# Patient Record
Sex: Male | Born: 1965
Health system: Southern US, Community
[De-identification: ages and names within clinical notes are randomized; demographics above are authoritative.]

## PROBLEM LIST (undated history)

## (undated) DIAGNOSIS — E119 Type 2 diabetes mellitus without complications: Secondary | ICD-10-CM

## (undated) DIAGNOSIS — I10 Essential (primary) hypertension: Secondary | ICD-10-CM

## (undated) HISTORY — PX: COLONOSCOPY: SHX174

## (undated) HISTORY — PX: SHOULDER SURGERY: SHX246

---

## 2006-06-05 ENCOUNTER — Emergency Department (HOSPITAL_COMMUNITY): Admission: EM | Admit: 2006-06-05 | Discharge: 2006-06-05 | Payer: Self-pay | Admitting: Emergency Medicine

## 2009-12-23 ENCOUNTER — Emergency Department (HOSPITAL_COMMUNITY): Admission: EM | Admit: 2009-12-23 | Discharge: 2009-12-23 | Payer: Self-pay | Admitting: Emergency Medicine

## 2019-04-15 ENCOUNTER — Other Ambulatory Visit: Payer: Self-pay

## 2019-04-15 ENCOUNTER — Encounter (HOSPITAL_COMMUNITY): Payer: Self-pay

## 2019-04-15 ENCOUNTER — Observation Stay (HOSPITAL_COMMUNITY)
Admission: EM | Admit: 2019-04-15 | Discharge: 2019-04-17 | Disposition: A | Discharge status: Home / Self Care | Payer: Self-pay | Attending: Internal Medicine | Admitting: Internal Medicine

## 2019-04-15 ENCOUNTER — Emergency Department (HOSPITAL_COMMUNITY): Payer: Self-pay

## 2019-04-15 DIAGNOSIS — R29898 Other symptoms and signs involving the musculoskeletal system: Secondary | ICD-10-CM | POA: Insufficient documentation

## 2019-04-15 DIAGNOSIS — Z20828 Contact with and (suspected) exposure to other viral communicable diseases: Secondary | ICD-10-CM | POA: Insufficient documentation

## 2019-04-15 DIAGNOSIS — R209 Unspecified disturbances of skin sensation: Secondary | ICD-10-CM

## 2019-04-15 DIAGNOSIS — E876 Hypokalemia: Secondary | ICD-10-CM | POA: Insufficient documentation

## 2019-04-15 DIAGNOSIS — Z794 Long term (current) use of insulin: Secondary | ICD-10-CM

## 2019-04-15 DIAGNOSIS — E119 Type 2 diabetes mellitus without complications: Secondary | ICD-10-CM

## 2019-04-15 DIAGNOSIS — R55 Syncope and collapse: Principal | ICD-10-CM | POA: Insufficient documentation

## 2019-04-15 DIAGNOSIS — E860 Dehydration: Secondary | ICD-10-CM | POA: Insufficient documentation

## 2019-04-15 DIAGNOSIS — I10 Essential (primary) hypertension: Secondary | ICD-10-CM | POA: Insufficient documentation

## 2019-04-15 DIAGNOSIS — E1169 Type 2 diabetes mellitus with other specified complication: Secondary | ICD-10-CM

## 2019-04-15 DIAGNOSIS — I639 Cerebral infarction, unspecified: Secondary | ICD-10-CM

## 2019-04-15 DIAGNOSIS — E1165 Type 2 diabetes mellitus with hyperglycemia: Secondary | ICD-10-CM | POA: Insufficient documentation

## 2019-04-15 HISTORY — DX: Essential (primary) hypertension: I10

## 2019-04-15 HISTORY — DX: Type 2 diabetes mellitus without complications: E11.9

## 2019-04-15 LAB — CBC
HCT: 39.5 % (ref 39.0–52.0)
Hemoglobin: 12.7 g/dL — ABNORMAL LOW (ref 13.0–17.0)
MCH: 26 pg (ref 26.0–34.0)
MCHC: 32.2 g/dL (ref 30.0–36.0)
MCV: 80.8 fL (ref 80.0–100.0)
Platelets: 383 10*3/uL (ref 150–400)
RBC: 4.89 MIL/uL (ref 4.22–5.81)
RDW: 14 % (ref 11.5–15.5)
WBC: 6.7 10*3/uL (ref 4.0–10.5)
nRBC: 0 % (ref 0.0–0.2)

## 2019-04-15 LAB — COMPREHENSIVE METABOLIC PANEL
ALT: 36 U/L (ref 0–44)
AST: 24 U/L (ref 15–41)
Albumin: 4.3 g/dL (ref 3.5–5.0)
Alkaline Phosphatase: 52 U/L (ref 38–126)
Anion gap: 8 (ref 5–15)
BUN: 20 mg/dL (ref 6–20)
CO2: 27 mmol/L (ref 22–32)
Calcium: 9 mg/dL (ref 8.9–10.3)
Chloride: 100 mmol/L (ref 98–111)
Creatinine, Ser: 1.43 mg/dL — ABNORMAL HIGH (ref 0.61–1.24)
GFR calc Af Amer: >60 mL/min (ref >60)
GFR calc non Af Amer: 56 mL/min — ABNORMAL LOW (ref >60)
Glucose, Bld: 237 mg/dL — ABNORMAL HIGH (ref 70–99)
Potassium: 3.3 mmol/L — ABNORMAL LOW (ref 3.5–5.1)
Sodium: 135 mmol/L (ref 135–145)
Total Bilirubin: 0.9 mg/dL (ref 0.3–1.2)
Total Protein: 7.7 g/dL (ref 6.5–8.1)

## 2019-04-15 LAB — CK: Total CK: 370 U/L (ref 49–397)

## 2019-04-15 LAB — CBG MONITORING, ED: Glucose-Capillary: 227 mg/dL — ABNORMAL HIGH (ref 70–99)

## 2019-04-15 LAB — TROPONIN I: Troponin I: <0.03 ng/mL (ref <0.03)

## 2019-04-15 MED ORDER — SODIUM CHLORIDE 0.9 % IV BOLUS
1000.0000 mL | Freq: Once | INTRAVENOUS | Status: AC
  Administered 2019-04-15: 1000 mL via INTRAVENOUS

## 2019-04-15 MED ORDER — SODIUM CHLORIDE 0.9 % IV SOLN
INTRAVENOUS | Status: DC
  Administered 2019-04-15: via INTRAVENOUS

## 2019-04-15 NOTE — ED Provider Notes (Addendum)
[facility] EMERGENCY DEPARTMENT Provider Note   CSN: [id number] Arrival date & time: 04/15/19  1956    History   Chief Complaint Chief Complaint  Patient presents with   Dizziness    HPI Bonnita NasutiGeorge E Zwahlen is a 53 y.o. male.     Patient with no history of diabetes.  And history of hypertension.  Patient was fishing today.  He stopped at [employer] to get some dinner after fishing.  He had just eaten some chicken and he started to feel as if he was got a pass out.  Felt very lightheaded felt a little dizzy no true room spinning.  Felt as if he was can have some difficulty walking got very tired feeling.  No chest pain no headache no abdominal pain no shortness of breath.  Maybe a little slight numbness in his right hand.  Felt as if he was going to pass out but he did not pass out.  And felt as if maybe his vision was off some.     Past Medical History:  Diagnosis Date   Diabetes mellitus without complication (HCC)    Hypertension     There are no active problems to display for this patient.   Past Surgical History:  Procedure Laterality Date   SHOULDER SURGERY          Home Medications    Prior to Admission medications   Medication Sig Start Date End Date Taking? Authorizing Provider  acarbose (PRECOSE) 100 MG tablet Take 100 mg by mouth 2 (two) times a day.   Yes [provider]  amLODipine (NORVASC) 5 MG tablet Take 5 mg by mouth every evening.   Yes [provider]  benazepril (LOTENSIN) 10 MG tablet Take 10 mg by mouth every evening.   Yes [provider]  glyBURIDE-metformin (GLUCOVANCE) 5-500 MG tablet Take 1 tablet by mouth every evening.   Yes [provider]  hydrochlorothiazide (HYDRODIURIL) 25 MG tablet Take 25 mg by mouth every evening.   Yes [provider]  insulin NPH Human (NOVOLIN N) 100 UNIT/ML injection Inject 20 Units into the skin daily.   Yes [provider]  insulin regular (NOVOLIN R) 100  units/mL injection Inject 10 Units into the skin 2 (two) times a day.   Yes [provider]  Omega-3 Fatty Acids (FISH OIL PO) Take 1 capsule by mouth every evening.   Yes [provider]    Family History No family history on file.  Social History Social History   Tobacco Use   Smoking status: Never Smoker   Smokeless tobacco: Never Used  Substance Use Topics   Alcohol use: Never    Frequency: Never   Drug use: Never     Allergies   Patient has no known allergies.   Review of Systems Review of Systems  Constitutional: Positive for fatigue. Negative for chills and fever.  HENT: Negative for congestion, rhinorrhea and sore throat.   Eyes: Positive for visual disturbance. Negative for photophobia.  Respiratory: Negative for cough and shortness of breath.   Cardiovascular: Negative for chest pain and leg swelling.  Gastrointestinal: Negative for abdominal pain, diarrhea, nausea and vomiting.  Genitourinary: Negative for dysuria.  Musculoskeletal: Negative for back pain and neck pain.  Skin: Negative for rash.  Neurological: Positive for dizziness, light-headedness and numbness. Negative for syncope, speech difficulty, weakness and headaches.  Hematological: Does not bruise/bleed easily.  Psychiatric/Behavioral: Negative for confusion.     Physical Exam Updated Vital  Signs BP 114/67    Pulse 95    Temp 98.2 F (36.8 C) (Oral)    Resp 17    Ht 1.626 m (5\' 4" )    Wt 95.3 kg    SpO2 100%    BMI 36.05 kg/m   Physical Exam Vitals signs and nursing note reviewed.  Constitutional:      General: He is not in acute distress.    Appearance: Normal appearance. He is well-developed.  HENT:     Head: Normocephalic and atraumatic.  Eyes:     Extraocular Movements: Extraocular movements intact.     Conjunctiva/sclera: Conjunctivae normal.     Pupils: Pupils are equal, round, and reactive to light.  Neck:     Musculoskeletal: Neck supple.  Cardiovascular:      Rate and Rhythm: Normal rate and regular rhythm.     Heart sounds: No murmur.  Pulmonary:     Effort: Pulmonary effort is normal. No respiratory distress.     Breath sounds: Normal breath sounds.  Abdominal:     Palpations: Abdomen is soft.     Tenderness: There is no abdominal tenderness.  Musculoskeletal:        General: No swelling.  Skin:    General: Skin is warm and dry.     Capillary Refill: Capillary refill takes less than 2 seconds.  Neurological:     General: No focal deficit present.     Mental Status: He is alert and oriented to person, place, and time.     Cranial Nerves: No cranial nerve deficit.     Sensory: No sensory deficit.     Motor: No weakness.     Coordination: Coordination abnormal.     Comments: Slight coordination difference finger-to-nose with the right hand.  Patient is right-hand dominant.  Left hand was fine.      ED Treatments / Results  Labs (all labs ordered are listed, but only abnormal results are displayed) Labs Reviewed  CBC - Abnormal; Notable for the following components:      Result Value   Hemoglobin 12.7 (*)    All other components within normal limits  COMPREHENSIVE METABOLIC PANEL - Abnormal; Notable for the following components:   Potassium 3.3 (*)    Glucose, Bld 237 (*)    Creatinine, Ser 1.43 (*)    GFR calc non Af Amer 56 (*)    All other components within normal limits  CBG MONITORING, ED - Abnormal; Notable for the following components:   Glucose-Capillary 227 (*)    All other components within normal limits  SARS CORONAVIRUS 2 (HOSPITAL ORDER, [place] [facility])  CK  TROPONIN I  URINALYSIS, ROUTINE W REFLEX MICROSCOPIC    EKG EKG Interpretation  Date/Time:  Saturday April 15 2019 20:37:02 EDT Ventricular Rate:  109 PR Interval:    QRS Duration: 102 QT Interval:  331 QTC Calculation: 446 R Axis:   -7 Text Interpretation:  Sinus tachycardia RSR' in V1 or V2, right VCD or RVH ST elevation  suggests acute pericarditis No previous ECGs available Confirmed by Fredia Sorrow [phone]) on 04/15/2019 8:50:14 PM   Radiology Dg Chest 2 View  Result Date: 04/15/2019 CLINICAL DATA:  Near syncope EXAM: CHEST - 2 VIEW COMPARISON:  06/05/2006 FINDINGS: The heart size and mediastinal contours are within normal limits. Both lungs are clear. The visualized skeletal structures are unremarkable. IMPRESSION: No active cardiopulmonary disease. Electronically Signed   By: Donavan Foil M.D.   On: 04/15/2019  23:23   Ct Head Wo Contrast  Result Date: 04/15/2019 CLINICAL DATA:  Ataxia EXAM: CT HEAD WITHOUT CONTRAST TECHNIQUE: Contiguous axial images were obtained from the base of the skull through the vertex without intravenous contrast. COMPARISON:  None. FINDINGS: Brain: No evidence of acute infarction, hemorrhage, hydrocephalus, extra-axial collection or mass lesion/mass effect. Vascular: No hyperdense vessel or unexpected calcification. Skull: Normal. Negative for fracture or focal lesion. Sinuses/Orbits: No acute finding. Other: None IMPRESSION: Negative non contrasted CT appearance of the brain Electronically Signed   By: Jasmine PangKim  Fujinaga M.D.   On: 04/15/2019 23:21    Procedures Procedures (including critical care time)  CRITICAL CARE Performed by: Vanetta MuldersScott Amabel Stmarie Total critical care time: 30 minutes Critical care time was exclusive of separately billable procedures and treating other patients. Critical care was necessary to treat or prevent imminent or life-threatening deterioration. Critical care was time spent personally by me on the following activities: development of treatment plan with patient and/or surrogate as well as nursing, discussions with consultants, evaluation of patient's response to treatment, examination of patient, obtaining history from patient or surrogate, ordering and performing treatments and interventions, ordering and review of laboratory studies, ordering and review of  radiographic studies, pulse oximetry and re-evaluation of patient's condition.   Medications Ordered in ED Medications  0.9 %  sodium chloride infusion (has no administration in time range)  sodium chloride 0.9 % bolus 1,000 mL (1,000 mLs Intravenous New Bag/Given 04/15/19 2222)     Initial Impression / Assessment and Plan / ED Course  I have reviewed the triage vital signs and the nursing notes.  Pertinent labs & imaging results that were available during my care of the patient were reviewed by me and considered in my medical decision making (see chart for details).        Patient with some risk factors for stroke.  May have had somewhat of a stroke presentation.  Patient had sudden onset of not feeling well.  Thought maybe was going to pass out but he did not.  Had some dizziness.  Had some numbness in his right hand felt as if his vision was off a little bit.  Felt as if his coordination was off some.  Symptoms could have be secondary to just near syncopal episode or could be secondary to a subtle CVA.  Patient head CT pending.  Patient most likely will require admission.  May need [facility].   Scan without any acute findings.  We will go and order the COVID testing.  Feel patient needs admission.  MRI not available here tonight.   Patient still feels a little bit off in his eyes.  Still has a little bit of numbness in the hand.  Overall he is improving.  Patient symptoms were very subtle.  Patient never a candidate for TPA. Final Clinical Impressions(s) / ED Diagnoses   Final diagnoses:  Near syncope  Cerebrovascular accident (CVA), unspecified mechanism [facility](HCC)    ED Discharge Orders    None       Vanetta MuldersZackowski, Nilda Keathley, MD 04/15/19 2316    Vanetta MuldersZackowski, Abhimanyu Cruces, MD 04/15/19 [id number]    Vanetta MuldersZackowski, Adaley Kiene, MD 04/15/19 2336

## 2019-04-15 NOTE — H&P (Signed)
TRH H&P    Patient Demographics:    Keith HellingGeorge Best, is a 53 y.o. male  MRN: [id number]  DOB - 02/16/1966  Admit Date - 04/15/2019  Referring MD/NP/PA: Idalia NeedleScott Zachowski  Outpatient Primary MD for the patient is Toma DeitersHasanaj, Xaje A, MD  Patient coming from:  home  Chief complaint- right arm weakness   HPI:    Keith Best  is a 53 y.o. male,w hypertension, dm2, apparently was out fishing when experienced dizziness "lightheadedness" , vision off in both eyes, and generalized weakness.  ER physician commented that the patient had right arm weakness and poor finger to nose but the pt states that he had generalized weakness, but the thing that concerned him the most was the dizziness.  No vertigo.   Pt denies headache, cp, palp, sob, n/v, abd pain, diarrhea, brbpr.   In ED,  T 98.2, P 81  R 18, Bp 102/66 pox 98 Wt 95.3 kg   CT brain IMPRESSION: Negative non contrasted CT appearance of the brain  CXR IMPRESSION: No active cardiopulmonary disease.  Wbc 6.7, Hgb 12.7, Plt 383 Na 135, K 3.3, Bun 20, Creatinine 1.43 Trop <0.03  Glucose 237  Pt hydrated with ns iv in ED.  Pt states he feels back to normal.   Pt will be admitted observation for dizziness , near syncope.    Review of systems:    In addition to the HPI above,  No Fever-chills, No Headache, No changes with Vision or hearing, No problems swallowing food or Liquids, No Chest pain, Cough or Shortness of Breath, No Abdominal pain, No Nausea or Vomiting, bowel movements are regular, No Blood in stool or Urine, No dysuria, No new skin rashes or bruises, No new joints pains-aches,  No new weakness, tingling, numbness in any extremity, No recent weight gain or loss, No polyuria, polydypsia or polyphagia, No significant Mental Stressors.  All other systems reviewed and are negative.    Past History of the following :    Past Medical  History:  Diagnosis Date   Diabetes mellitus without complication (HCC)    Hypertension       Past Surgical History:  Procedure Laterality Date   SHOULDER SURGERY        Social History:      Social History   Tobacco Use   Smoking status: Never Smoker   Smokeless tobacco: Never Used  Substance Use Topics   Alcohol use: Never    Frequency: Never       Family History :     Family History  Problem Relation Age of Onset   Hypertension Mother    Diabetes Mother    Hypertension Father        Home Medications:   Prior to Admission medications   Medication Sig Start Date End Date Taking? Authorizing Provider  acarbose (PRECOSE) 100 MG tablet Take 100 mg by mouth 2 (two) times a day.   Yes [provider]  amLODipine (NORVASC) 5 MG tablet Take 5 mg by mouth every evening.   Yes  [provider]  benazepril (LOTENSIN) 10 MG tablet Take 10 mg by mouth every evening.   Yes [provider]  glyBURIDE-metformin (GLUCOVANCE) 5-500 MG tablet Take 1 tablet by mouth every evening.   Yes [provider]  hydrochlorothiazide (HYDRODIURIL) 25 MG tablet Take 25 mg by mouth every evening.   Yes [provider]  insulin NPH Human (NOVOLIN N) 100 UNIT/ML injection Inject 20 Units into the skin daily.   Yes [provider]  insulin regular (NOVOLIN R) 100 units/mL injection Inject 10 Units into the skin 2 (two) times a day.   Yes [provider]  Omega-3 Fatty Acids (FISH OIL PO) Take 1 capsule by mouth every evening.   Yes [provider]     Allergies:    No Known Allergies   Physical Exam:   Vitals  Blood pressure 103/60, pulse 72, temperature (!) 97.5 F (36.4 C), temperature source Oral, resp. rate 16, height 5\' 4"  (1.626 m), weight 95.3 kg, SpO2 97 %.  1.  General: axox3  2. Psychiatric: euthymic  3. Neurologic: cn2-12 intact, reflexes 2+ symmetric, diffuse with no clonus, motor 5/5 in all  4 ext  4. HEENMT:  Anicteric, pupils 1.895mm symmetric, direct, consensual, near intact Mucous membranes slightly dry. Tongue midline, No facial droop Neck: no jvd, no bruit  5. Respiratory : CTAB  6. Cardiovascular : rrr s1, s2, no m/g/r  7. Gastrointestinal:  Abd: soft, nt, nd, +bs  8. Skin:  Ext: no c/c/e,  No rash  9.Musculoskeletal:  Good ROM  No adenopathy    Data Review:    CBC Recent Labs  Lab 04/15/19 2207  WBC 6.7  HGB 12.7*  HCT 39.5  PLT 383  MCV 80.8  MCH 26.0  MCHC 32.2  RDW 14.0   ------------------------------------------------------------------------------------------------------------------  Results for orders placed or performed during the hospital encounter of 04/15/19 (from the past 48 hour(s))  CBG monitoring, ED     Status: Abnormal   Collection Time: 04/15/19  8:11 PM  Result Value Ref Range   Glucose-Capillary 227 (H) 70 - 99 mg/dL  CBC     Status: Abnormal   Collection Time: 04/15/19 10:07 PM  Result Value Ref Range   WBC 6.7 4.0 - 10.5 K/uL   RBC 4.89 4.22 - 5.81 MIL/uL   Hemoglobin 12.7 (L) 13.0 - 17.0 g/dL   HCT 16.139.5 09.639.0 - 04.552.0 %   MCV 80.8 80.0 - 100.0 fL   MCH 26.0 26.0 - 34.0 pg   MCHC 32.2 30.0 - 36.0 g/dL   RDW 40.914.0 81.111.5 - 91.415.5 %   Platelets 383 150 - 400 K/uL   nRBC 0.0 0.0 - 0.2 %    Comment: Performed at [facility], [address] [place], [place] [address]  Comprehensive metabolic panel     Status: Abnormal   Collection Time: 04/15/19 10:07 PM  Result Value Ref Range   Sodium 135 135 - 145 mmol/L   Potassium 3.3 (L) 3.5 - 5.1 mmol/L   Chloride 100 98 - 111 mmol/L   CO2 27 22 - 32 mmol/L   Glucose, Bld 237 (H) 70 - 99 mg/dL   BUN 20 6 - 20 mg/dL   Creatinine, Ser 6.211.43 (H) 0.61 - 1.24 mg/dL   Calcium 9.0 8.9 - 30.810.3 mg/dL   Total Protein 7.7 6.5 - 8.1 g/dL   Albumin 4.3 3.5 - 5.0 g/dL   AST 24 15 - 41 U/L   ALT 36 0 - 44  U/L   Alkaline Phosphatase 52 38 - 126 U/L   Total Bilirubin 0.9 0.3 - 1.2 mg/dL    GFR calc non Af Amer 56 (L) >60 mL/min   GFR calc Af Amer >60 >60 mL/min   Anion gap 8 5 - 15    Comment: Performed at [facility], [address], [place], [place] [address]  CK     Status: None   Collection Time: 04/15/19 10:07 PM  Result Value Ref Range   Total CK 370 49 - 397 U/L    Comment: Performed at [facility], [address], [place], [place] [address]  Troponin I - Once     Status: None   Collection Time: 04/15/19 10:07 PM  Result Value Ref Range   Troponin I <0.03 <0.03 ng/mL    Comment: Performed at [facility], [address] [place], [place] [address]  SARS Coronavirus 2 (CEPHEID - Performed in [facility] hospital lab), Hosp Order     Status: None   Collection Time: 04/15/19 11:36 PM   Specimen: Nasopharyngeal Swab  Result Value Ref Range   SARS Coronavirus 2 NEGATIVE NEGATIVE    Comment: (NOTE) If result is NEGATIVE SARS-CoV-2 target nucleic acids are NOT DETECTED. The SARS-CoV-2 RNA is generally detectable in upper and lower  respiratory specimens during the acute phase of infection. The lowest  concentration of SARS-CoV-2 viral copies this assay can detect is 250  copies / mL. A negative result does not preclude SARS-CoV-2 infection  and should not be used as the sole basis for treatment or other  patient management decisions.  A negative result may occur with  improper specimen collection / handling, submission of specimen other  than nasopharyngeal swab, presence of viral mutation(s) within the  areas targeted by this assay, and inadequate number of viral copies  (<250 copies / mL). A negative result must be combined with clinical  observations, patient history, and epidemiological information. If result is POSITIVE SARS-CoV-2 target nucleic acids are DETECTED. The SARS-CoV-2 RNA is generally detectable in upper and lower  respiratory specimens dur ing the acute phase of infection.  Positive  results are indicative of active infection with SARS-CoV-2.   Clinical  correlation with patient history and other diagnostic information is  necessary to determine patient infection status.  Positive results do  not rule out bacterial infection or co-infection with other viruses. If result is PRESUMPTIVE POSTIVE SARS-CoV-2 nucleic acids MAY BE PRESENT.   A presumptive positive result was obtained on the submitted specimen  and confirmed on repeat testing.  While 2019 novel coronavirus  (SARS-CoV-2) nucleic acids may be present in the submitted sample  additional confirmatory testing may be necessary for epidemiological  and / or clinical management purposes  to differentiate between  SARS-CoV-2 and other Sarbecovirus currently known to infect humans.  If clinically indicated additional testing with an alternate test  methodology [id number]) is advised. The SARS-CoV-2 RNA is generally  detectable in upper and lower respiratory sp ecimens during the acute  phase of infection. The expected result is Negative. Fact Sheet for Patients:  StrictlyIdeas.no Fact Sheet for Healthcare Providers: BankingDealers.co.za This test is not yet approved or cleared by the [place] FDA and has been authorized for detection and/or diagnosis of SARS-CoV-2 by FDA under an Emergency Use Authorization (EUA).  This EUA will remain in effect (meaning this test can be used) for the duration of the COVID-19 declaration under Section 564(b)(1) of the Act, 21 U.S.C. section 360bbb-3(b)(1), unless  the authorization is terminated or revoked sooner. Performed at [facility], [address] [place], [place] [address]     Chemistries  Recent Labs  Lab 04/15/19 2207  NA 135  K 3.3*  CL 100  CO2 27  GLUCOSE 237*  BUN 20  CREATININE 1.43*  CALCIUM 9.0  AST 24  ALT 36  ALKPHOS 52  BILITOT 0.9    ------------------------------------------------------------------------------------------------------------------  ------------------------------------------------------------------------------------------------------------------ GFR: Estimated Creatinine Clearance: 62.9 mL/min (A) (by C-G formula based on SCr of 1.43 mg/dL (H)). Liver Function Tests: Recent Labs  Lab 04/15/19 2207  AST 24  ALT 36  ALKPHOS 52  BILITOT 0.9  PROT 7.7  ALBUMIN 4.3   No results for input(s): LIPASE, AMYLASE in the last 168 hours. No results for input(s): AMMONIA in the last 168 hours. Coagulation Profile: No results for input(s): INR, PROTIME in the last 168 hours. Cardiac Enzymes: Recent Labs  Lab 04/15/19 2207  CKTOTAL 370  TROPONINI <0.03   BNP (last 3 results) No results for input(s): PROBNP in the last 8760 hours. HbA1C: No results for input(s): HGBA1C in the last 72 hours. CBG: Recent Labs  Lab 04/15/19 2011  GLUCAP 227*   Lipid Profile: No results for input(s): CHOL, HDL, LDLCALC, TRIG, CHOLHDL, LDLDIRECT in the last 72 hours. Thyroid Function Tests: No results for input(s): TSH, T4TOTAL, FREET4, T3FREE, THYROIDAB in the last 72 hours. Anemia Panel: No results for input(s): VITAMINB12, FOLATE, FERRITIN, TIBC, IRON, RETICCTPCT in the last 72 hours.  --------------------------------------------------------------------------------------------------------------- Urine analysis: No results found for: COLORURINE, APPEARANCEUR, LABSPEC, PHURINE, GLUCOSEU, HGBUR, BILIRUBINUR, KETONESUR, PROTEINUR, UROBILINOGEN, NITRITE, LEUKOCYTESUR    Imaging Results:    Dg Chest 2 View  Result Date: 04/15/2019 CLINICAL DATA:  Near syncope EXAM: CHEST - 2 VIEW COMPARISON:  06/05/2006 FINDINGS: The heart size and mediastinal contours are within normal limits. Both lungs are clear. The visualized skeletal structures are unremarkable. IMPRESSION: No active cardiopulmonary disease. Electronically  Signed   By: Jasmine Pang M.D.   On: 04/15/2019 23:23   Ct Head Wo Contrast  Result Date: 04/15/2019 CLINICAL DATA:  Ataxia EXAM: CT HEAD WITHOUT CONTRAST TECHNIQUE: Contiguous axial images were obtained from the base of the skull through the vertex without intravenous contrast. COMPARISON:  None. FINDINGS: Brain: No evidence of acute infarction, hemorrhage, hydrocephalus, extra-axial collection or mass lesion/mass effect. Vascular: No hyperdense vessel or unexpected calcification. Skull: Normal. Negative for fracture or focal lesion. Sinuses/Orbits: No acute finding. Other: None IMPRESSION: Negative non contrasted CT appearance of the brain Electronically Signed   By: Jasmine Pang M.D.   On: 04/15/2019 23:21       Assessment & Plan:    Principal Problem:   Near syncope Active Problems:   Diabetes (HCC)   Essential hypertension  Dizziness, near syncope Tele Trop I q6h x3 Check MRI brain Check carotid ultrasound Check cardiac echo Repeat 12 lead ekg Check orthostatic bp  Tachycardia Tele Check Trop I q6h x3, TSH D dimer , if positive then CTA chest r/o PE  Hypokalemia Replete Check cmp in am  Hypertension Cont Amlodipine  po qday Cont Benazepril  po qday Cont Hydrochlorothiazide  Dm2 Cont Acarbose Cont Glyburide Metformin Cont NPH, Novolin R FSBS ac and qhs, ISS    DVT Prophylaxis-   Lovenox - SCDs   AM Labs Ordered, also please review Full Orders  Family Communication: Admission, patients condition and plan of care including tests being ordered have been discussed with the patient and wife  who indicate understanding  and agree with the plan and Code Status.  Code Status:  FULL   Admission status: Observation : Based on patients clinical presentation and evaluation of above clinical data, I have made determination that patient meets observation criteria at this time.  Time spent in minutes : 70   Pearson GrippeJames Tatanisha Cuthbert M.D on 04/16/2019 at 1:49 AM

## 2019-04-15 NOTE — ED Triage Notes (Signed)
Pt reports feeling "funny" like a "dizzy funny". Pt says he started feeling this way while fishing this afternoon. Pt c/o dry mouth, dizziness, difficulty concentrating, fatigue. Pt is diabetic and does not take medication as prescribed "all the time". Pt reports drinking water today, 2-3 bottles.

## 2019-04-15 NOTE — ED Notes (Signed)
Pt states he is not able to urinate at this time, aware of DO, urinal placed at bedside & pt advised to hit call light when sample is ready

## 2019-04-16 ENCOUNTER — Observation Stay (HOSPITAL_BASED_OUTPATIENT_CLINIC_OR_DEPARTMENT_OTHER): Payer: Self-pay

## 2019-04-16 ENCOUNTER — Observation Stay (HOSPITAL_COMMUNITY): Payer: Self-pay

## 2019-04-16 DIAGNOSIS — R55 Syncope and collapse: Secondary | ICD-10-CM | POA: Diagnosis present

## 2019-04-16 DIAGNOSIS — E119 Type 2 diabetes mellitus without complications: Secondary | ICD-10-CM

## 2019-04-16 DIAGNOSIS — I10 Essential (primary) hypertension: Secondary | ICD-10-CM | POA: Diagnosis present

## 2019-04-16 DIAGNOSIS — R209 Unspecified disturbances of skin sensation: Secondary | ICD-10-CM

## 2019-04-16 LAB — URINALYSIS, ROUTINE W REFLEX MICROSCOPIC
Bacteria, UA: NONE SEEN
Bilirubin Urine: NEGATIVE
Glucose, UA: >=500 mg/dL — AB
Hgb urine dipstick: NEGATIVE
Ketones, ur: NEGATIVE mg/dL
Leukocytes,Ua: NEGATIVE
Nitrite: NEGATIVE
Protein, ur: NEGATIVE mg/dL
Specific Gravity, Urine: 1.021 (ref 1.005–1.030)
pH: 6 (ref 5.0–8.0)

## 2019-04-16 LAB — TROPONIN I
Troponin I: <0.03 ng/mL (ref <0.03)
Troponin I: <0.03 ng/mL (ref <0.03)
Troponin I: <0.03 ng/mL (ref <0.03)

## 2019-04-16 LAB — ECHOCARDIOGRAM COMPLETE
Height: 64 in
Weight: 3255.75 oz

## 2019-04-16 LAB — CBC
HCT: 38.8 % — ABNORMAL LOW (ref 39.0–52.0)
Hemoglobin: 12.6 g/dL — ABNORMAL LOW (ref 13.0–17.0)
MCH: 26.7 pg (ref 26.0–34.0)
MCHC: 32.5 g/dL (ref 30.0–36.0)
MCV: 82.2 fL (ref 80.0–100.0)
Platelets: 323 10*3/uL (ref 150–400)
RBC: 4.72 MIL/uL (ref 4.22–5.81)
RDW: 14 % (ref 11.5–15.5)
WBC: 7.1 10*3/uL (ref 4.0–10.5)
nRBC: 0 % (ref 0.0–0.2)

## 2019-04-16 LAB — TSH
TSH: 0.496 u[IU]/mL (ref 0.350–4.500)
TSH: 0.578 u[IU]/mL (ref 0.350–4.500)

## 2019-04-16 LAB — GLUCOSE, CAPILLARY
Glucose-Capillary: 180 mg/dL — ABNORMAL HIGH (ref 70–99)
Glucose-Capillary: 205 mg/dL — ABNORMAL HIGH (ref 70–99)
Glucose-Capillary: 89 mg/dL (ref 70–99)
Glucose-Capillary: 146 mg/dL — ABNORMAL HIGH (ref 70–99)

## 2019-04-16 LAB — COMPREHENSIVE METABOLIC PANEL
ALT: 31 U/L (ref 0–44)
AST: 20 U/L (ref 15–41)
Albumin: 3.6 g/dL (ref 3.5–5.0)
Alkaline Phosphatase: 53 U/L (ref 38–126)
Anion gap: 5 (ref 5–15)
BUN: 19 mg/dL (ref 6–20)
CO2: 25 mmol/L (ref 22–32)
Calcium: 8.4 mg/dL — ABNORMAL LOW (ref 8.9–10.3)
Chloride: 105 mmol/L (ref 98–111)
Creatinine, Ser: 1.27 mg/dL — ABNORMAL HIGH (ref 0.61–1.24)
GFR calc Af Amer: >60 mL/min (ref >60)
GFR calc non Af Amer: >60 mL/min (ref >60)
Glucose, Bld: 227 mg/dL — ABNORMAL HIGH (ref 70–99)
Potassium: 3.9 mmol/L (ref 3.5–5.1)
Sodium: 135 mmol/L (ref 135–145)
Total Bilirubin: 0.9 mg/dL (ref 0.3–1.2)
Total Protein: 6.7 g/dL (ref 6.5–8.1)

## 2019-04-16 LAB — VITAMIN B12: Vitamin B-12: 189 pg/mL (ref 180–914)

## 2019-04-16 LAB — T4, FREE: Free T4: 0.81 ng/dL (ref 0.61–1.12)

## 2019-04-16 LAB — MAGNESIUM: Magnesium: 2.2 mg/dL (ref 1.7–2.4)

## 2019-04-16 LAB — RAPID URINE DRUG SCREEN, HOSP PERFORMED
Amphetamines: NOT DETECTED
Barbiturates: NOT DETECTED
Benzodiazepines: NOT DETECTED
Cocaine: NOT DETECTED
Opiates: NOT DETECTED
Tetrahydrocannabinol: POSITIVE — AB

## 2019-04-16 LAB — D-DIMER, QUANTITATIVE: D-Dimer, Quant: <0.27 ug/mL-FEU (ref 0.00–0.50)

## 2019-04-16 LAB — FOLATE: Folate: 9.4 ng/mL (ref >5.9)

## 2019-04-16 LAB — SARS CORONAVIRUS 2 BY RT PCR (HOSPITAL ORDER, PERFORMED IN ~~LOC~~ HOSPITAL LAB): SARS Coronavirus 2: NEGATIVE

## 2019-04-16 LAB — CK: Total CK: 302 U/L (ref 49–397)

## 2019-04-16 MED ORDER — INSULIN ASPART 100 UNIT/ML ~~LOC~~ SOLN: 0.0000 [IU] | Freq: Every day | SUBCUTANEOUS | Status: DC

## 2019-04-16 MED ORDER — ACETAMINOPHEN 650 MG RE SUPP: 650.0000 mg | Freq: Four times a day (QID) | RECTAL | Status: DC | PRN

## 2019-04-16 MED ORDER — POTASSIUM CHLORIDE IN NACL 20-0.9 MEQ/L-% IV SOLN
INTRAVENOUS | Status: AC
  Administered 2019-04-16 – 2019-04-17 (×2): via INTRAVENOUS

## 2019-04-16 MED ORDER — BENAZEPRIL HCL 10 MG PO TABS: 10.0000 mg | ORAL_TABLET | Freq: Every evening | ORAL | Status: DC

## 2019-04-16 MED ORDER — GLYBURIDE 5 MG PO TABS: 5.0000 mg | ORAL_TABLET | Freq: Every day | ORAL | Status: DC

## 2019-04-16 MED ORDER — POTASSIUM CHLORIDE CRYS ER 20 MEQ PO TBCR
20.0000 meq | EXTENDED_RELEASE_TABLET | Freq: Once | ORAL | Status: AC
  Administered 2019-04-16: 02:00:00 20 meq via ORAL
  Filled 2019-04-16: qty 1

## 2019-04-16 MED ORDER — SODIUM CHLORIDE 0.9% FLUSH: 3.0000 mL | Freq: Two times a day (BID) | INTRAVENOUS | Status: DC

## 2019-04-16 MED ORDER — GLYBURIDE-METFORMIN 5-500 MG PO TABS: 1.0000 | ORAL_TABLET | Freq: Every evening | ORAL | Status: DC

## 2019-04-16 MED ORDER — INSULIN ASPART 100 UNIT/ML ~~LOC~~ SOLN
0.0000 [IU] | Freq: Three times a day (TID) | SUBCUTANEOUS | Status: DC
  Administered 2019-04-16: 14:00:00 3 [IU] via SUBCUTANEOUS
  Administered 2019-04-16: 2 [IU] via SUBCUTANEOUS
  Administered 2019-04-17: 1 [IU] via SUBCUTANEOUS
  Administered 2019-04-17: 3 [IU] via SUBCUTANEOUS

## 2019-04-16 MED ORDER — AMLODIPINE BESYLATE 5 MG PO TABS
5.0000 mg | ORAL_TABLET | Freq: Every evening | ORAL | Status: DC
  Administered 2019-04-16: 18:00:00 5 mg via ORAL
  Filled 2019-04-16: qty 1

## 2019-04-16 MED ORDER — SODIUM CHLORIDE 0.9% FLUSH: 3.0000 mL | INTRAVENOUS | Status: DC | PRN

## 2019-04-16 MED ORDER — ACARBOSE 50 MG PO TABS
100.0000 mg | ORAL_TABLET | Freq: Two times a day (BID) | ORAL | Status: DC
  Administered 2019-04-16 (×2): 100 mg via ORAL
  Filled 2019-04-16: qty 2
  Filled 2019-04-16: qty 1
  Filled 2019-04-16 (×2): qty 2
  Filled 2019-04-16: qty 1
  Filled 2019-04-16: qty 2
  Filled 2019-04-16: qty 1

## 2019-04-16 MED ORDER — ENOXAPARIN SODIUM 60 MG/0.6ML ~~LOC~~ SOLN
0.5000 mg/kg | SUBCUTANEOUS | Status: DC
  Administered 2019-04-16 – 2019-04-17 (×2): 50 mg via SUBCUTANEOUS
  Filled 2019-04-16 (×2): qty 0.6

## 2019-04-16 MED ORDER — INSULIN ASPART 100 UNIT/ML ~~LOC~~ SOLN
8.0000 [IU] | Freq: Two times a day (BID) | SUBCUTANEOUS | Status: DC
  Administered 2019-04-16 – 2019-04-17 (×3): 8 [IU] via SUBCUTANEOUS

## 2019-04-16 MED ORDER — INSULIN NPH (HUMAN) (ISOPHANE) 100 UNIT/ML ~~LOC~~ SUSP
20.0000 [IU] | Freq: Every day | SUBCUTANEOUS | Status: DC
  Administered 2019-04-16 – 2019-04-17 (×2): 20 [IU] via SUBCUTANEOUS
  Filled 2019-04-16: qty 10

## 2019-04-16 MED ORDER — ACETAMINOPHEN 325 MG PO TABS: 650.0000 mg | ORAL_TABLET | Freq: Four times a day (QID) | ORAL | Status: DC | PRN

## 2019-04-16 MED ORDER — SODIUM CHLORIDE 0.9 % IV SOLN: 250.0000 mL | INTRAVENOUS | Status: DC | PRN

## 2019-04-16 MED ORDER — METFORMIN HCL 500 MG PO TABS: 500.0000 mg | ORAL_TABLET | Freq: Every day | ORAL | Status: DC

## 2019-04-16 MED ORDER — HYDROCHLOROTHIAZIDE 25 MG PO TABS: 25.0000 mg | ORAL_TABLET | Freq: Every evening | ORAL | Status: DC

## 2019-04-16 NOTE — Discharge Summary (Addendum)
Physician Discharge Summary  Keith Best WIO:[id number] DOB: [birth year] DOA: 04/15/2019  PCP: Neale Burly, MD  Admit date: 04/15/2019 Discharge date: 04/17/2019  Admitted From: Home Disposition:  Home  Recommendations for Outpatient Follow-up:  1. Follow up with PCP in 1-2 weeks 2. Please obtain BMP/CBC in one week   Discharge Condition: Stable CODE STATUS: FULL Diet recommendation: Heart Healthy / Carb Modified    Brief/Interim Summary: 53 year old male with a history of diabetes mellitus, hypertension presenting with vertigo and near syncope. The patient states that after 30 minutes of fishing, he developed some dizziness. He felt like his head was swimming. He went to lay down for about 15 minutes. During that time, he developed some right upper extremity numbness and tingling which lasted about 5 minutes. He felt like he had some transient visual disturbance/blurry vision. He denied any focal extremity weakness, nausea, vomiting, chest pain, shortness of breath, headache, fevers, chills. He felt like his mouth was dry at the time. He tried to stand up subsequently and continued to have worsening dizziness. As result he presented for further evaluation. He denies any alcohol or illegal drug use or tobacco. He states that he has been working out in the sun for approximately 3 hours before going to fishing 04/15/2019. He states that he recently had an increase in dosages of his diabetic medications. He denies any NSAIDs or other new medications. He stated that the entire episode lasted about 3 hours. In the emergency room, the patient was afebrile hemodynamically stable with mild tachycardia. Oxygen saturation was 99% room air. BMP showed a potassium 3.3 with sodium 135 and serum creatinine 1.43. CBC and LFTs were essentially unremarkable.  Discharge Diagnoses:  Near Syncope/Sensory Disturbance -MRI brain--neg -likely due to dehydration and hypokalemia -orthostatic  vitals--neg -carotid US-negative for hemodynamically significant stenosis -IVF x 24 hours -Echo--EF 60-65%, impaired relaxation, trivial MR -Urinalysis negative pyuria -Urine drug screen--+THC -Serum B12--189--borderline low, may be contributing--start supplement -Folic EQAS--3.4 -HDQ--2.229 -mag--2.0  Dehydration -Patient presented with serum creatinine 1.43 -Unknown renal baseline -Continue IV fluids -Urinalysis--negative pyuria or ketones -Holding HCTZ--restart after d/c -Holding benazepril--restart after d/c  Diabetes mellitus type 2, uncontrolled with hyperglycemia -Hemoglobin A1c -NovoLog sliding scale -Holding metformin and glyburide--restart after d/c -pt states he just had his DM medications increased by his PCP 2-3 wks prior to this admission -I encouraged pt to follow up with PCP for further intensification of tx if indicated  Essential hypertension -Holding HCTZ--restart after d/c -restart benazepril -Continue amlodipine  Hypokalemia -Repleted -Magnesium 2.0   Discharge Instructions   Allergies as of 04/17/2019   No Known Allergies     Medication List    TAKE these medications   acarbose 100 MG tablet Commonly known as: PRECOSE Take 100 mg by mouth 2 (two) times a day.   amLODipine 5 MG tablet Commonly known as: NORVASC Take 5 mg by mouth every evening.   benazepril 10 MG tablet Commonly known as: LOTENSIN Take 10 mg by mouth every evening.   FISH OIL PO Take 1 capsule by mouth every evening.   glyBURIDE-metformin 5-500 MG tablet Commonly known as: GLUCOVANCE Take 1 tablet by mouth every evening.   hydrochlorothiazide 25 MG tablet Commonly known as: HYDRODIURIL Take 25 mg by mouth every evening.   insulin NPH Human 100 UNIT/ML injection Commonly known as: NOVOLIN N Inject 20 Units into the skin daily.   insulin regular 100 units/mL injection Commonly known as: NOVOLIN R Inject 10 Units into the skin 2 (two)  times a day.        No Known Allergies  Consultations:  none   Procedures/Studies: Dg Chest 2 View  Result Date: 04/15/2019 CLINICAL DATA:  Near syncope EXAM: CHEST - 2 VIEW COMPARISON:  06/05/2006 FINDINGS: The heart size and mediastinal contours are within normal limits. Both lungs are clear. The visualized skeletal structures are unremarkable. IMPRESSION: No active cardiopulmonary disease. Electronically Signed   By: Jasmine Pang M.D.   On: 04/15/2019 23:23   Ct Head Wo Contrast  Result Date: 04/15/2019 CLINICAL DATA:  Ataxia EXAM: CT HEAD WITHOUT CONTRAST TECHNIQUE: Contiguous axial images were obtained from the base of the skull through the vertex without intravenous contrast. COMPARISON:  None. FINDINGS: Brain: No evidence of acute infarction, hemorrhage, hydrocephalus, extra-axial collection or mass lesion/mass effect. Vascular: No hyperdense vessel or unexpected calcification. Skull: Normal. Negative for fracture or focal lesion. Sinuses/Orbits: No acute finding. Other: None IMPRESSION: Negative non contrasted CT appearance of the brain Electronically Signed   By: Jasmine Pang M.D.   On: 04/15/2019 23:21   Mr Brain Wo Contrast  Result Date: 04/17/2019 CLINICAL DATA:  Ataxia EXAM: MRI HEAD WITHOUT CONTRAST TECHNIQUE: Multiplanar, multiecho pulse sequences of the brain and surrounding structures were obtained without intravenous contrast. COMPARISON:  Head CT from 2 days ago FINDINGS: Brain: No acute infarction, hemorrhage, hydrocephalus, extra-axial collection or mass lesion. No white matter disease or atrophy Vascular: Major flow voids are preserved Skull and upper cervical spine: Normal marrow signal. Sinuses/Orbits: Negative. IMPRESSION: Negative brain MRI. Electronically Signed   By: Marnee Spring M.D.   On: 04/17/2019 09:42   US Carotid Bilateral  Result Date: 04/16/2019 CLINICAL DATA:  Dizziness. EXAM: BILATERAL CAROTID DUPLEX ULTRASOUND TECHNIQUE: Wallace Cullens scale imaging, color Doppler and  duplex ultrasound were performed of bilateral carotid and vertebral arteries in the neck. COMPARISON:  None. FINDINGS: Criteria: Quantification of carotid stenosis is based on velocity parameters that correlate the residual internal carotid diameter with NASCET-based stenosis levels, using the diameter of the distal internal carotid lumen as the denominator for stenosis measurement. The following velocity measurements were obtained: RIGHT ICA: 85/32 cm/sec CCA: 85/18 cm/sec SYSTOLIC ICA/CCA RATIO:  1.0 ECA: 119 cm/sec LEFT ICA: 84/32 cm/sec CCA: 104/20 cm/sec SYSTOLIC ICA/CCA RATIO:  0.8 ECA: 155 cm/sec RIGHT CAROTID ARTERY: Small amount of heterogeneous plaque at the right carotid bulb and proximal internal carotid artery. Normal waveforms and velocities in the internal carotid artery. External carotid artery is patent with normal waveform. RIGHT VERTEBRAL ARTERY: Antegrade flow and normal waveform in the right vertebral artery. LEFT CAROTID ARTERY: External carotid artery is patent with normal waveform. Left carotid arteries are patent without significant plaque. Normal waveforms and velocities in the internal carotid artery. LEFT VERTEBRAL ARTERY: Antegrade flow and normal waveform in the left vertebral artery. IMPRESSION: 1. Mild atherosclerotic plaque at the right carotid bulb and right internal carotid artery. Estimated degree of stenosis in the right internal carotid artery is less than 50%. 2. No significant plaque in left carotid arteries. No significant stenosis in left internal carotid artery. 3. Patent vertebral arteries with antegrade flow. Electronically Signed   By: Richarda Overlie M.D.   On: 04/16/2019 16:12        Discharge Exam: Vitals:   04/16/19 2152 04/17/19 0900  BP: (!) 145/77   Pulse: 71   Resp: 17   Temp: 98.2 F (36.8 C)   SpO2: 98% 96%   Vitals:   04/16/19 1300 04/16/19 2152 04/17/19 0500 04/17/19 0900  BP:  117/63 (!) 145/77    Pulse: 77 71    Resp: 17 17    Temp: 98.1 F  (36.7 C) 98.2 F (36.8 C)    TempSrc: Oral     SpO2:  98%  96%  Weight: 92.3 kg  92.3 kg   Height:        General: Pt is alert, awake, not in acute distress Cardiovascular: RRR, S1/S2 +, no rubs, no gallops Respiratory: CTA bilaterally, no wheezing, no rhonchi Abdominal: Soft, NT, ND, bowel sounds + Extremities: no edema, no cyanosis   The results of significant diagnostics from this hospitalization (including imaging, microbiology, ancillary and laboratory) are listed below for reference.    Significant Diagnostic Studies: Dg Chest 2 View  Result Date: 04/15/2019 CLINICAL DATA:  Near syncope EXAM: CHEST - 2 VIEW COMPARISON:  06/05/2006 FINDINGS: The heart size and mediastinal contours are within normal limits. Both lungs are clear. The visualized skeletal structures are unremarkable. IMPRESSION: No active cardiopulmonary disease. Electronically Signed   By: Jasmine PangKim  Fujinaga M.D.   On: 04/15/2019 23:23   Ct Head Wo Contrast  Result Date: 04/15/2019 CLINICAL DATA:  Ataxia EXAM: CT HEAD WITHOUT CONTRAST TECHNIQUE: Contiguous axial images were obtained from the base of the skull through the vertex without intravenous contrast. COMPARISON:  None. FINDINGS: Brain: No evidence of acute infarction, hemorrhage, hydrocephalus, extra-axial collection or mass lesion/mass effect. Vascular: No hyperdense vessel or unexpected calcification. Skull: Normal. Negative for fracture or focal lesion. Sinuses/Orbits: No acute finding. Other: None IMPRESSION: Negative non contrasted CT appearance of the brain Electronically Signed   By: Jasmine PangKim  Fujinaga M.D.   On: 04/15/2019 23:21   Mr Brain Wo Contrast  Result Date: 04/17/2019 CLINICAL DATA:  Ataxia EXAM: MRI HEAD WITHOUT CONTRAST TECHNIQUE: Multiplanar, multiecho pulse sequences of the brain and surrounding structures were obtained without intravenous contrast. COMPARISON:  Head CT from 2 days ago FINDINGS: Brain: No acute infarction, hemorrhage, hydrocephalus,  extra-axial collection or mass lesion. No white matter disease or atrophy Vascular: Major flow voids are preserved Skull and upper cervical spine: Normal marrow signal. Sinuses/Orbits: Negative. IMPRESSION: Negative brain MRI. Electronically Signed   By: Marnee SpringJonathon  Watts M.D.   On: 04/17/2019 09:42   [place] Carotid Bilateral  Result Date: 04/16/2019 CLINICAL DATA:  Dizziness. EXAM: BILATERAL CAROTID DUPLEX ULTRASOUND TECHNIQUE: Wallace CullensGray scale imaging, color Doppler and duplex ultrasound were performed of bilateral carotid and vertebral arteries in the neck. COMPARISON:  None. FINDINGS: Criteria: Quantification of carotid stenosis is based on velocity parameters that correlate the residual internal carotid diameter with NASCET-based stenosis levels, using the diameter of the distal internal carotid lumen as the denominator for stenosis measurement. The following velocity measurements were obtained: RIGHT ICA: 85/32 cm/sec CCA: 85/18 cm/sec SYSTOLIC ICA/CCA RATIO:  1.0 ECA: 119 cm/sec LEFT ICA: 84/32 cm/sec CCA: 104/20 cm/sec SYSTOLIC ICA/CCA RATIO:  0.8 ECA: 155 cm/sec RIGHT CAROTID ARTERY: Small amount of heterogeneous plaque at the right carotid bulb and proximal internal carotid artery. Normal waveforms and velocities in the internal carotid artery. External carotid artery is patent with normal waveform. RIGHT VERTEBRAL ARTERY: Antegrade flow and normal waveform in the right vertebral artery. LEFT CAROTID ARTERY: External carotid artery is patent with normal waveform. Left carotid arteries are patent without significant plaque. Normal waveforms and velocities in the internal carotid artery. LEFT VERTEBRAL ARTERY: Antegrade flow and normal waveform in the left vertebral artery. IMPRESSION: 1. Mild atherosclerotic plaque at the right carotid bulb and right internal carotid artery. Estimated degree of stenosis  in the right internal carotid artery is less than 50%. 2. No significant plaque in left carotid arteries. No  significant stenosis in left internal carotid artery. 3. Patent vertebral arteries with antegrade flow. Electronically Signed   By: Richarda OverlieAdam  Henn M.D.   On: 04/16/2019 16:12     Microbiology: Recent Results (from the past 240 hour(s))  SARS Coronavirus 2 (CEPHEID - Performed [facility]), Hosp Order     Status: None   Collection Time: 04/15/19 11:36 PM   Specimen: Nasopharyngeal Swab  Result Value Ref Range Status   SARS Coronavirus 2 NEGATIVE NEGATIVE Final    Comment: (NOTE) If result is NEGATIVE SARS-CoV-2 target nucleic acids are NOT DETECTED. The SARS-CoV-2 RNA is generally detectable in upper and lower  respiratory specimens during the acute phase of infection. The lowest  concentration of SARS-CoV-2 viral copies this assay can detect is 250  copies / mL. A negative result does not preclude SARS-CoV-2 infection  and should not be used as the sole basis for treatment or other  patient management decisions.  A negative result may occur with  improper specimen collection / handling, submission of specimen other  than nasopharyngeal swab, presence of viral mutation(s) within the  areas targeted by this assay, and inadequate number of viral copies  (<250 copies / mL). A negative result must be combined with clinical  observations, patient history, and epidemiological information. If result is POSITIVE SARS-CoV-2 target nucleic acids are DETECTED. The SARS-CoV-2 RNA is generally detectable in upper and lower  respiratory specimens dur ing the acute phase of infection.  Positive  results are indicative of active infection with SARS-CoV-2.  Clinical  correlation with patient history and other diagnostic information is  necessary to determine patient infection status.  Positive results do  not rule out bacterial infection or co-infection with other viruses. If result is PRESUMPTIVE POSTIVE SARS-CoV-2 nucleic acids MAY BE PRESENT.   A presumptive positive result was  obtained on the submitted specimen  and confirmed on repeat testing.  While 2019 novel coronavirus  (SARS-CoV-2) nucleic acids may be present in the submitted sample  additional confirmatory testing may be necessary for epidemiological  and / or clinical management purposes  to differentiate between  SARS-CoV-2 and other Sarbecovirus currently known to infect humans.  If clinically indicated additional testing with an alternate test  methodology [phone]([id number]) is advised. The SARS-CoV-2 RNA is generally  detectable in upper and lower respiratory sp ecimens during the acute  phase of infection. The expected result is Negative. Fact Sheet for Patients:  BoilerBrush.com.cyhttps://www.fda.gov/media/136312/download Fact Sheet for Healthcare Providers: https://pope.com/https://www.fda.gov/media/136313/download This test is not yet approved or cleared by the [place] FDA and has been authorized for detection and/or diagnosis of SARS-CoV-2 by FDA under an Emergency Use Authorization (EUA).  This EUA will remain in effect (meaning this test can be used) for the duration of the COVID-19 declaration under Section 564(b)(1) of the Act, 21 U.S.C. section 360bbb-3(b)(1), unless the authorization is terminated or revoked sooner. Performed at [facility], [address] [place], [place] [id number]      Labs: Basic Metabolic Panel: Recent Labs  Lab 04/15/19 2207 04/16/19 0531 04/16/19 0930 04/17/19 0553  NA 135 135  --  136  K 3.3* 3.9  --  3.8  CL 100 105  --  107  CO2 27 25  --  23  GLUCOSE 237* 227*  --  193*  BUN 20 19  --  15  CREATININE 1.43*  1.27*  --  1.13  CALCIUM 9.0 8.4*  --  8.2*  MG  --   --  2.2 2.1   Liver Function Tests: Recent Labs  Lab 04/15/19 2207 04/16/19 0531  AST 24 20  ALT 36 31  ALKPHOS 52 53  BILITOT 0.9 0.9  PROT 7.7 6.7  ALBUMIN 4.3 3.6   No results for input(s): LIPASE, AMYLASE in the last 168 hours. No results for input(s): AMMONIA in the last 168 hours. CBC: Recent Labs  Lab  04/15/19 2207 04/16/19 0531  WBC 6.7 7.1  HGB 12.7* 12.6*  HCT 39.5 38.8*  MCV 80.8 82.2  PLT 383 323   Cardiac Enzymes: Recent Labs  Lab 04/15/19 2207 04/16/19 0531 04/16/19 0930 04/16/19 1126 04/16/19 1751  CKTOTAL 370  --  302  --   --   TROPONINI <0.03 <0.03  --  <0.03 <0.03   BNP: Invalid input(s): POCBNP CBG: Recent Labs  Lab 04/16/19 0718 04/16/19 1118 04/16/19 1605 04/16/19 2155 04/17/19 0730  GLUCAP 180* 205* 89 146* 201*    Time coordinating discharge:  36 minutes  Signed:  Catarina Hartshornavid Maddalena Linarez, DO [facility] Pager: [phone] 04/17/2019, 10:29 AM

## 2019-04-16 NOTE — Progress Notes (Signed)
PROGRESS NOTE  Keith Best QQV:[id number] DOB: 02/06/1966 DOA: 04/15/2019 PCP: Keith DeitersHasanaj, Keith A, MD  Brief History:  53 year old male with a history of diabetes mellitus, hypertension presenting with vertigo and near syncope.  The patient states that after 30 minutes of fishing, he developed some dizziness.  He felt like his head was swimming.  He went to lay down for about 15 minutes.  During that time, he developed some right upper extremity numbness and tingling which lasted about 5 minutes.  He felt like he had some transient visual disturbance/blurry vision.  He denied any focal extremity weakness, nausea, vomiting, chest pain, shortness of breath, headache, fevers, chills.  He felt like his mouth was dry at the time.  He tried to stand up subsequently and continued to have worsening dizziness.  As result he presented for further evaluation.  He denies any alcohol or illegal drug use or tobacco.  He states that he has been working out in the sun for approximately 3 hours before going to fishing 04/15/2019.  He states that he recently had an increase in dosages of his diabetic medications.  He denies any NSAIDs or other new medications.  He stated that the entire episode lasted about 3 hours.  In the emergency room, the patient was afebrile hemodynamically stable with mild tachycardia.  Oxygen saturation was 99% room air.  BMP showed a potassium 3.3 with sodium 135 and serum creatinine 1.43.  CBC and LFTs were essentially unremarkable.  Assessment/Plan: Near Syncope/Sensory Disturbance/Visual Disturbance -MRI brain -orthostatic vitals -carotid US -IVF x 24 hours -Echo -Urinalysis -Urine drug screen -Serum B12 -Folic acid -TSH  Dehydration -Patient presented with serum creatinine 1.43 -Unknown renal baseline -Continue IV fluids -Urinalysis -Holding HCTZ -Holding benazepril  Diabetes mellitus type 2, uncontrolled with hyperglycemia -Hemoglobin A1c -NovoLog sliding scale  -Holding metformin and glyburide  Essential hypertension -Holding HCTZ -Continue amlodipine       Disposition Plan:   Home 6/22 if stable Family Communication:  No Family at bedside  Consultants:  none  Code Status:  FULL  DVT Prophylaxis:   [place] Lovenox   Procedures: As Listed in Progress Note Above  Antibiotics: None       Subjective: Patient denies fevers, chills, headache, chest pain, dyspnea, nausea, vomiting, diarrhea, abdominal pain, dysuria, hematuria, hematochezia, and melena.   Objective: Vitals:   04/16/19 0030 04/16/19 0144 04/16/19 0500 04/16/19 0538  BP: 102/66 103/60  120/71  Pulse: 81 72  62  Resp: 18 16  17   Temp:  (!) 97.5 F (36.4 C)  98.2 F (36.8 C)  TempSrc:  Oral    SpO2: 98% 97%  99%  Weight:  92.3 kg 92.3 kg   Height:        Intake/Output Summary (Last 24 hours) at 04/16/2019 0817 Last data filed at 04/16/2019 0500 Gross per 24 hour  Intake 1558.33 ml  Output -  Net 1558.33 ml   Weight change:  Exam:   General:  Pt is alert, follows commands appropriately, not in acute distress  HEENT: No icterus, No thrush, No neck mass, [place]/AT  Cardiovascular: RRR, S1/S2, no rubs, no gallops  Respiratory: CTA bilaterally, no wheezing, no crackles, no rhonchi  Abdomen: Soft/+BS, non tender, non distended, no guarding  Extremities: No edema, No lymphangitis, No petechiae, No rashes, no synovitis  Neuro:  CN II-XII intact, strength 4/5 in RUE, RLE, strength 4/5 LUE, LLE; sensation intact bilateral; no dysmetria; babinski equivocal  Data Reviewed: I have personally reviewed following labs and imaging studies Basic Metabolic Panel: Recent Labs  Lab 04/15/19 2207 04/16/19 0531  NA 135 135  K 3.3* 3.9  CL 100 105  CO2 27 25  GLUCOSE 237* 227*  BUN 20 19  CREATININE 1.43* 1.27*  CALCIUM 9.0 8.4*   Liver Function Tests: Recent Labs  Lab 04/15/19 2207 04/16/19 0531  AST 24 20  ALT 36 31  ALKPHOS 52 53  BILITOT 0.9  0.9  PROT 7.7 6.7  ALBUMIN 4.3 3.6   No results for input(s): LIPASE, AMYLASE in the last 168 hours. No results for input(s): AMMONIA in the last 168 hours. Coagulation Profile: No results for input(s): INR, PROTIME in the last 168 hours. CBC: Recent Labs  Lab 04/15/19 2207 04/16/19 0531  WBC 6.7 7.1  HGB 12.7* 12.6*  HCT 39.5 38.8*  MCV 80.8 82.2  PLT 383 323   Cardiac Enzymes: Recent Labs  Lab 04/15/19 2207 04/16/19 0531  CKTOTAL 370  --   TROPONINI <0.03 <0.03   BNP: Invalid input(s): POCBNP CBG: Recent Labs  Lab 04/15/19 2011 04/16/19 0718  GLUCAP 227* 180*   HbA1C: No results for input(s): HGBA1C in the last 72 hours. Urine analysis:    Component Value Date/Time   COLORURINE YELLOW 04/16/2019 0212   APPEARANCEUR CLEAR 04/16/2019 0212   LABSPEC 1.021 04/16/2019 0212   PHURINE 6.0 04/16/2019 0212   GLUCOSEU >=500 (A) 04/16/2019 0212   HGBUR NEGATIVE 04/16/2019 0212   BILIRUBINUR NEGATIVE 04/16/2019 0212   KETONESUR NEGATIVE 04/16/2019 0212   PROTEINUR NEGATIVE 04/16/2019 0212   NITRITE NEGATIVE 04/16/2019 0212   LEUKOCYTESUR NEGATIVE 04/16/2019 0212   Sepsis Labs: @LABRCNTIP (procalcitonin:4,lacticidven:4) ) Recent Results (from the past 240 hour(s))  SARS Coronavirus 2 (CEPHEID - Performed in [facility] lab), Hosp Order     Status: None   Collection Time: 04/15/19 11:36 PM   Specimen: Nasopharyngeal Swab  Result Value Ref Range Status   SARS Coronavirus 2 NEGATIVE NEGATIVE Final    Comment: (NOTE) If result is NEGATIVE SARS-CoV-2 target nucleic acids are NOT DETECTED. The SARS-CoV-2 RNA is generally detectable in upper and lower  respiratory specimens during the acute phase of infection. The lowest  concentration of SARS-CoV-2 viral copies this assay can detect is 250  copies / mL. A negative result does not preclude SARS-CoV-2 infection  and should not be used as the sole basis for treatment or other  patient management decisions.  A  negative result may occur with  improper specimen collection / handling, submission of specimen other  than nasopharyngeal swab, presence of viral mutation(s) within the  areas targeted by this assay, and inadequate number of viral copies  (<250 copies / mL). A negative result must be combined with clinical  observations, patient history, and epidemiological information. If result is POSITIVE SARS-CoV-2 target nucleic acids are DETECTED. The SARS-CoV-2 RNA is generally detectable in upper and lower  respiratory specimens dur ing the acute phase of infection.  Positive  results are indicative of active infection with SARS-CoV-2.  Clinical  correlation with patient history and other diagnostic information is  necessary to determine patient infection status.  Positive results do  not rule out bacterial infection or co-infection with other viruses. If result is PRESUMPTIVE POSTIVE SARS-CoV-2 nucleic acids MAY BE PRESENT.   A presumptive positive result was obtained on the submitted specimen  and confirmed on repeat testing.  While 2019 novel coronavirus  (SARS-CoV-2) nucleic acids may be present  in the submitted sample  additional confirmatory testing may be necessary for epidemiological  and / or clinical management purposes  to differentiate between  SARS-CoV-2 and other Sarbecovirus currently known to infect humans.  If clinically indicated additional testing with an alternate test  methodology [phone]) is advised. The SARS-CoV-2 RNA is generally  detectable in upper and lower respiratory sp ecimens during the acute  phase of infection. The expected result is Negative. Fact Sheet for Patients:  StrictlyIdeas.no Fact Sheet for Healthcare Providers: BankingDealers.co.za This test is not yet approved or cleared by the [place] FDA and has been authorized for detection and/or diagnosis of SARS-CoV-2 by FDA under an Emergency Use  Authorization (EUA).  This EUA will remain in effect (meaning this test can be used) for the duration of the COVID-19 declaration under Section 564(b)(1) of the Act, 21 U.S.C. section 360bbb-3(b)(1), unless the authorization is terminated or revoked sooner. Performed at [facility], [address] [place], [place] [address]      Scheduled Meds: . acarbose  100 mg Oral BID  . amLODipine  5 mg Oral QPM  . enoxaparin (LOVENOX) injection  0.5 mg/kg Subcutaneous Q24H  . insulin aspart  0-5 Units Subcutaneous QHS  . insulin aspart  0-9 Units Subcutaneous TID WC  . insulin aspart  8 Units Subcutaneous BID AC  . insulin NPH Human  20 Units Subcutaneous QAC breakfast  . sodium chloride flush  3 mL Intravenous Q12H   Continuous Infusions: . sodium chloride 100 mL/hr at 04/15/19 2349  . sodium chloride      Procedures/Studies: Dg Chest 2 View  Result Date: 04/15/2019 CLINICAL DATA:  Near syncope EXAM: CHEST - 2 VIEW COMPARISON:  06/05/2006 FINDINGS: The heart size and mediastinal contours are within normal limits. Both lungs are clear. The visualized skeletal structures are unremarkable. IMPRESSION: No active cardiopulmonary disease. Electronically Signed   By: Donavan Foil M.D.   On: 04/15/2019 23:23   Ct Head Wo Contrast  Result Date: 04/15/2019 CLINICAL DATA:  Ataxia EXAM: CT HEAD WITHOUT CONTRAST TECHNIQUE: Contiguous axial images were obtained from the base of the skull through the vertex without intravenous contrast. COMPARISON:  None. FINDINGS: Brain: No evidence of acute infarction, hemorrhage, hydrocephalus, extra-axial collection or mass lesion/mass effect. Vascular: No hyperdense vessel or unexpected calcification. Skull: Normal. Negative for fracture or focal lesion. Sinuses/Orbits: No acute finding. Other: None IMPRESSION: Negative non contrasted CT appearance of the brain Electronically Signed   By: Donavan Foil M.D.   On: 04/15/2019 23:21    Orson Eva, DO  [facility]  Pager [phone]  If 7PM-7AM, please contact night-coverage [email or web address] Password [facility] 04/16/2019, 8:17 AM   LOS: 0 days

## 2019-04-16 NOTE — Progress Notes (Signed)
*  PRELIMINARY RESULTS* Echocardiogram 2D Echocardiogram has been performed.  Leavy Cella 04/16/2019, 12:09 PM

## 2019-04-17 ENCOUNTER — Observation Stay (HOSPITAL_COMMUNITY): Payer: Self-pay

## 2019-04-17 LAB — BASIC METABOLIC PANEL
Anion gap: 6 (ref 5–15)
BUN: 15 mg/dL (ref 6–20)
CO2: 23 mmol/L (ref 22–32)
Calcium: 8.2 mg/dL — ABNORMAL LOW (ref 8.9–10.3)
Chloride: 107 mmol/L (ref 98–111)
Creatinine, Ser: 1.13 mg/dL (ref 0.61–1.24)
GFR calc Af Amer: >60 mL/min (ref >60)
GFR calc non Af Amer: >60 mL/min (ref >60)
Glucose, Bld: 193 mg/dL — ABNORMAL HIGH (ref 70–99)
Potassium: 3.8 mmol/L (ref 3.5–5.1)
Sodium: 136 mmol/L (ref 135–145)

## 2019-04-17 LAB — HIV ANTIBODY (ROUTINE TESTING W REFLEX): HIV Screen 4th Generation wRfx: NONREACTIVE

## 2019-04-17 LAB — HEMOGLOBIN A1C
Hgb A1c MFr Bld: 8.4 % — ABNORMAL HIGH (ref 4.8–5.6)
Mean Plasma Glucose: 194 mg/dL

## 2019-04-17 LAB — MAGNESIUM: Magnesium: 2.1 mg/dL (ref 1.7–2.4)

## 2019-04-17 LAB — GLUCOSE, CAPILLARY
Glucose-Capillary: 201 mg/dL — ABNORMAL HIGH (ref 70–99)
Glucose-Capillary: 135 mg/dL — ABNORMAL HIGH (ref 70–99)

## 2019-04-17 MED ORDER — VITAMIN B-12 100 MCG PO TABS
500.0000 ug | ORAL_TABLET | Freq: Every day | ORAL | Status: DC
  Administered 2019-04-17: 500 ug via ORAL
  Filled 2019-04-17: qty 5

## 2019-04-17 MED ORDER — CYANOCOBALAMIN 500 MCG PO TABS: 500.0000 ug | ORAL_TABLET | Freq: Every day | ORAL | Status: AC

## 2019-04-17 NOTE — Plan of Care (Signed)

## 2019-04-17 NOTE — Progress Notes (Signed)
Nsg Discharge Note  Admit Date:  04/15/2019 Discharge date: 04/17/2019   NAYEL PURDY to be D/C'd home  per MD order.  AVS completed.  Copy for chart, and copy for patient signed, and dated. Patient/caregiver able to verbalize understanding.  Discharge Medication: Allergies as of 04/17/2019   No Known Allergies     Medication List    TAKE these medications   acarbose 100 MG tablet Commonly known as: PRECOSE Take 100 mg by mouth 2 (two) times a day.   amLODipine 5 MG tablet Commonly known as: NORVASC Take 5 mg by mouth every evening.   benazepril 10 MG tablet Commonly known as: LOTENSIN Take 10 mg by mouth every evening.   FISH OIL PO Take 1 capsule by mouth every evening.   glyBURIDE-metformin 5-500 MG tablet Commonly known as: GLUCOVANCE Take 1 tablet by mouth every evening.   hydrochlorothiazide 25 MG tablet Commonly known as: HYDRODIURIL Take 25 mg by mouth every evening.   insulin NPH Human 100 UNIT/ML injection Commonly known as: NOVOLIN N Inject 20 Units into the skin daily.   insulin regular 100 units/mL injection Commonly known as: NOVOLIN R Inject 10 Units into the skin 2 (two) times a day.   vitamin B-12 500 MCG tablet Commonly known as: CYANOCOBALAMIN Take 1 tablet (500 mcg total) by mouth daily.       Discharge Assessment: Vitals:   04/16/19 2152 04/17/19 0900  BP: (!) 145/77   Pulse: 71   Resp: 17   Temp: 98.2 F (36.8 C)   SpO2: 98% 96%   Skin clean, dry and intact without evidence of skin break down, no evidence of skin tears noted. IV catheter discontinued intact. Site without signs and symptoms of complications - no redness or edema noted at insertion site, patient denies c/o pain - only slight tenderness at site.  Dressing with slight pressure applied.  D/c Instructions-Education: Discharge instructions given to patient/family with verbalized understanding. D/c education completed with patient/family including follow up  instructions, medication list, d/c activities limitations if indicated, with other d/c instructions as indicated by MD - patient able to verbalize understanding, all questions fully answered. Patient instructed to return to ED, call 911, or call MD for any changes in condition.  Patient escorted via [place], and D/C home via private auto.  Venita Sheffield, RN 04/17/2019 12:17 PM

## 2019-12-21 DIAGNOSIS — Z6836 Body mass index (BMI) 36.0-36.9, adult: Secondary | ICD-10-CM | POA: Diagnosis not present

## 2019-12-21 DIAGNOSIS — E1165 Type 2 diabetes mellitus with hyperglycemia: Secondary | ICD-10-CM | POA: Diagnosis not present

## 2019-12-21 DIAGNOSIS — I10 Essential (primary) hypertension: Secondary | ICD-10-CM | POA: Diagnosis not present

## 2019-12-21 DIAGNOSIS — E7849 Other hyperlipidemia: Secondary | ICD-10-CM | POA: Diagnosis not present

## 2020-03-21 DIAGNOSIS — I10 Essential (primary) hypertension: Secondary | ICD-10-CM | POA: Diagnosis not present

## 2020-03-21 DIAGNOSIS — E1165 Type 2 diabetes mellitus with hyperglycemia: Secondary | ICD-10-CM | POA: Diagnosis not present

## 2020-03-21 DIAGNOSIS — Z6835 Body mass index (BMI) 35.0-35.9, adult: Secondary | ICD-10-CM | POA: Diagnosis not present

## 2020-03-21 DIAGNOSIS — E7849 Other hyperlipidemia: Secondary | ICD-10-CM | POA: Diagnosis not present

## 2020-07-03 IMAGING — MR MRI HEAD WITHOUT CONTRAST
8 of 10 series · 35 of 48 positions shown · non-contrast
Comparison: Head CT from 2 days ago

CLINICAL DATA: Ataxia

EXAM:
MRI HEAD WITHOUT CONTRAST
TECHNIQUE: Multiplanar, multiecho pulse sequences of the brain and surrounding
structures were obtained without intravenous contrast.

[Series 2: T1 · sagittal · 5.0mm · 0.45mm/px · 2 of 20 slices shown (1 of 2)]
[im 1/20]
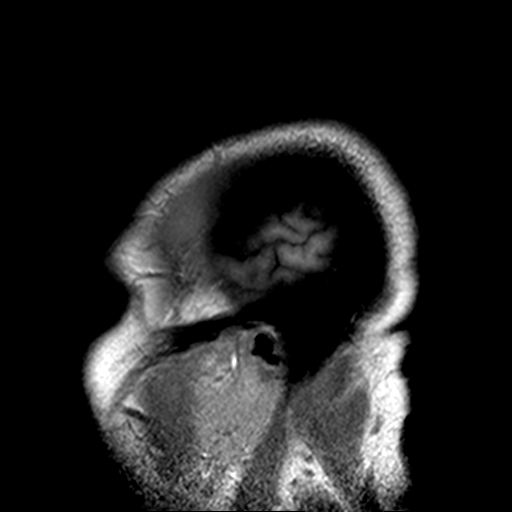
[im 20/20]
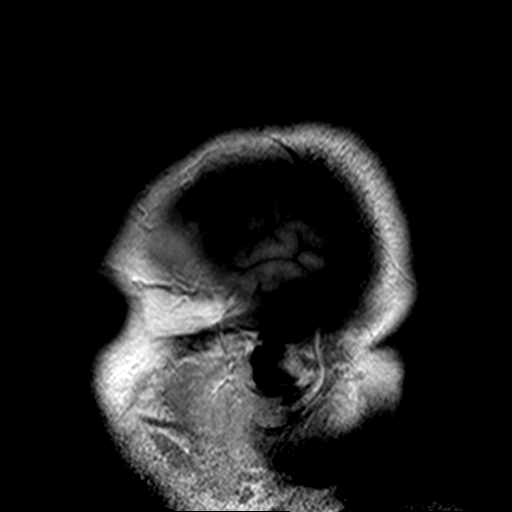

[Series 3: DWI · axial · 3.0mm · 0.73mm/px · z∈[-77,+85]mm · 7 of 55 slices shown (1 of 2)]
[im 1/55]
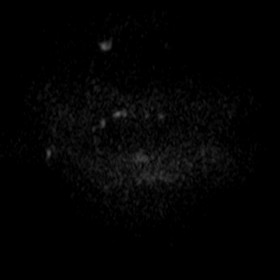
[im 10/55]
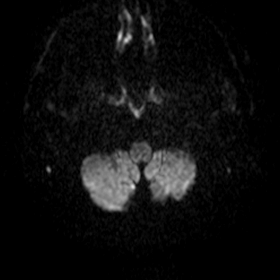
[im 19/55]
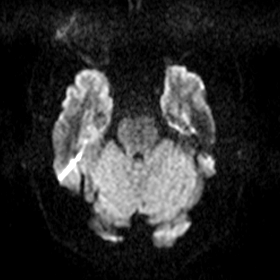
[im 28/55]
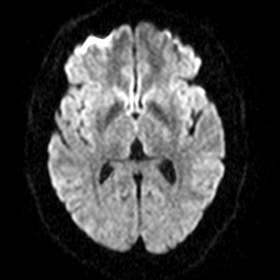
[im 37/55]
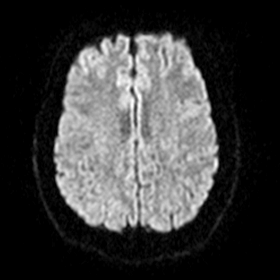
[im 46/55]
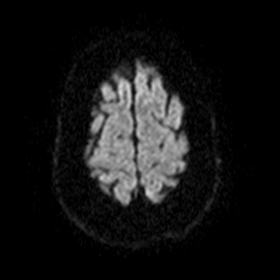
[im 55/55]
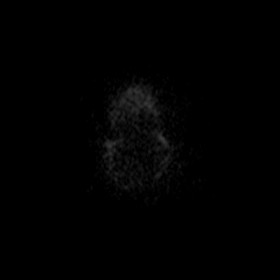

[Series 5: DWI · coronal · 5.0mm · 0.47mm/px · 4 of 34 slices shown (2 of 2)]
[im 1/34]
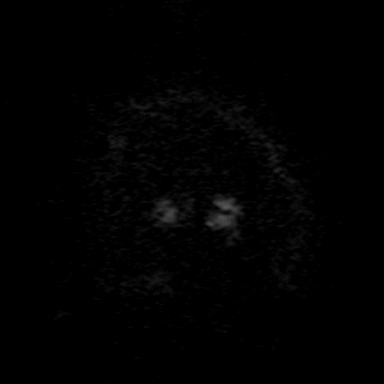
[im 12/34]
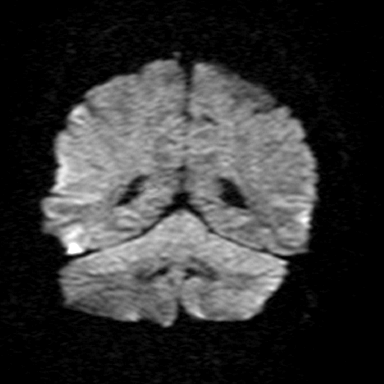
[im 23/34]
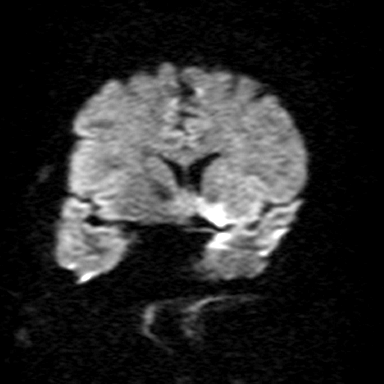
[im 34/34]
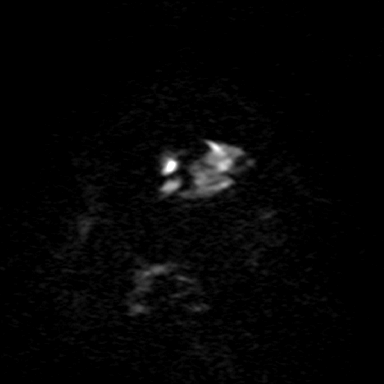

[Series 7: T2 · axial · 5.0mm · 0.65mm/px · z∈[-67,+76]mm · 3 of 23 slices shown (1 of 3)]
[im 1/23]
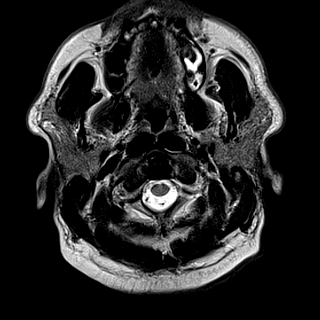
[im 12/23]
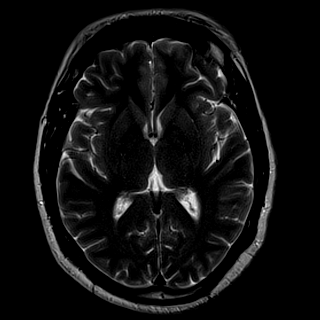
[im 23/23]
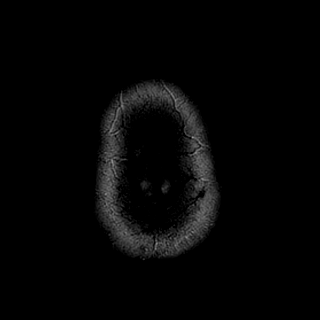

[Series 8: T2 · axial · 5.0mm · 0.40mm/px · z∈[-61,+69]mm · 3 of 21 slices shown (2 of 3)]
[im 1/21]
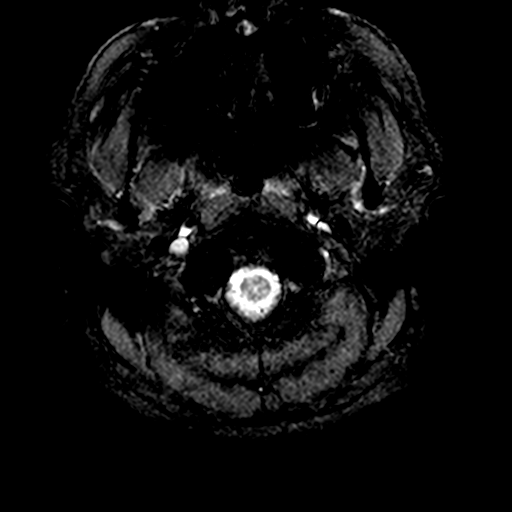
[im 11/21]
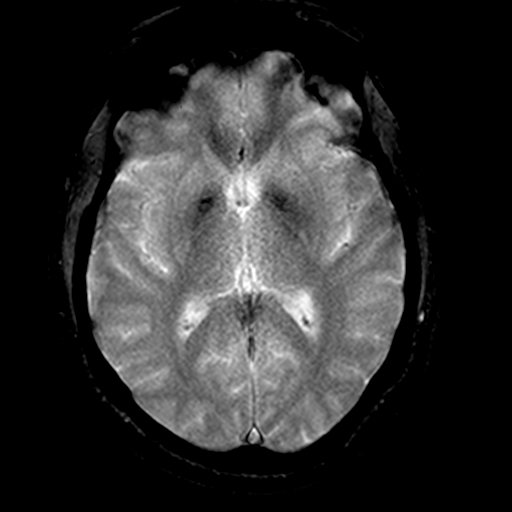
[im 21/21]
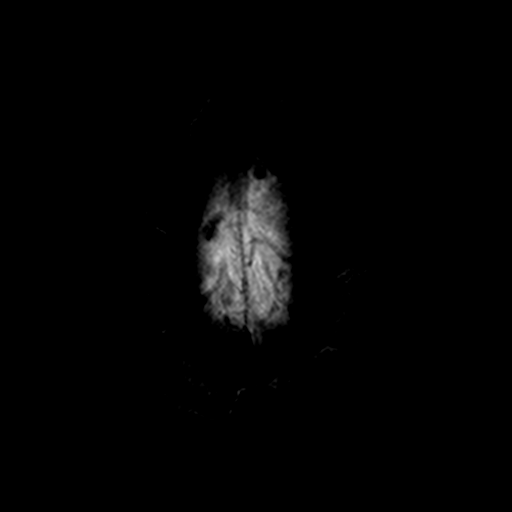

[Series 9: FLAIR · axial · 3.0mm · 0.85mm/px · z∈[-64,+73]mm · 6 of 47 slices shown]
[im 1/47]
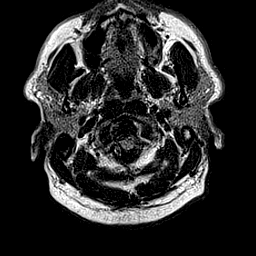
[im 10/47]
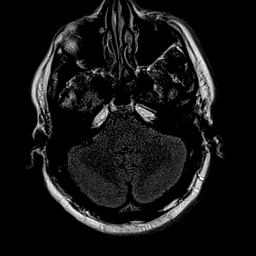
[im 19/47]
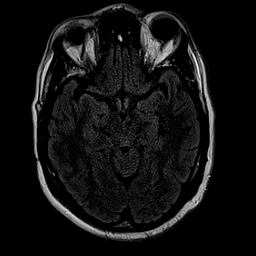
[im 28/47]
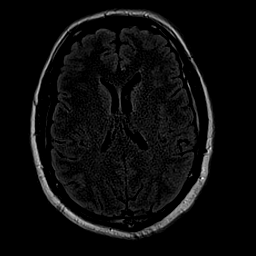
[im 37/47]
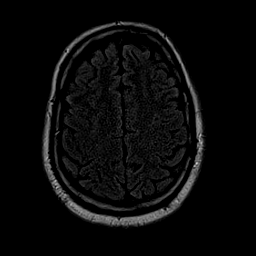
[im 47/47]
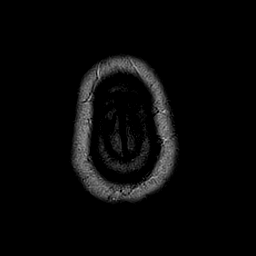

[Series 10: T1 · axial · 2.0mm · 0.43mm/px · z∈[-67,+59]mm · 7 of 74 slices shown (2 of 2)]
[im 1/74]
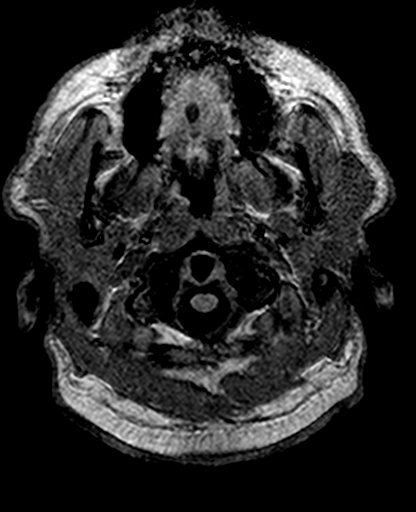
[im 10/74]
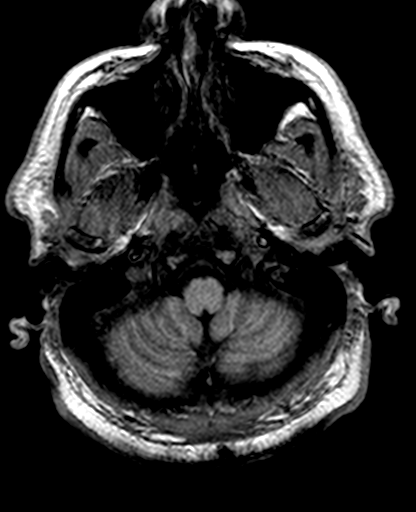
[im 19/74]
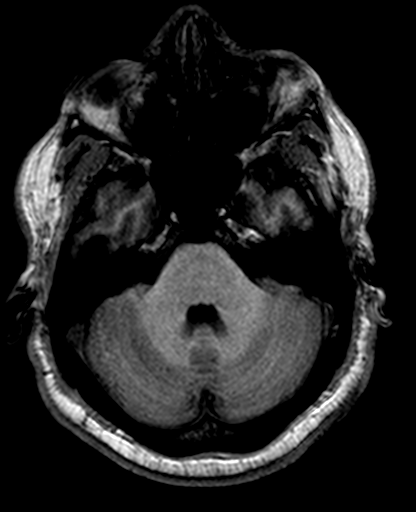
[im 28/74]
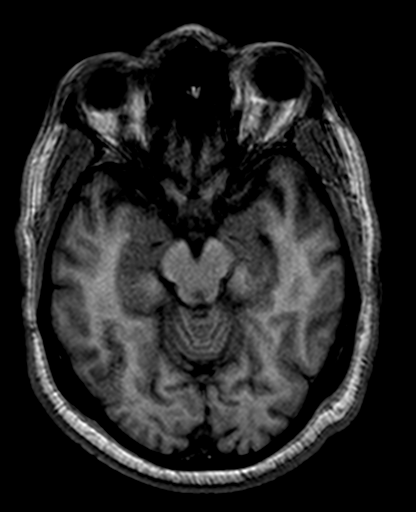
[im 46/74]
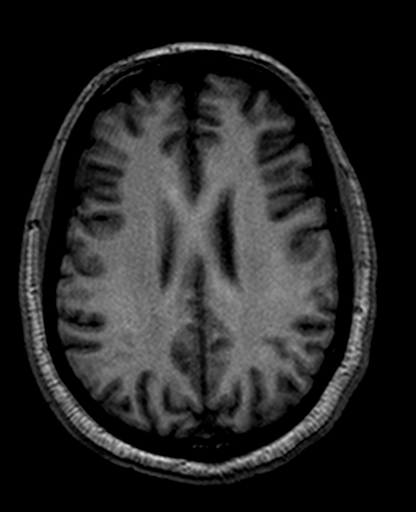
[im 55/74]
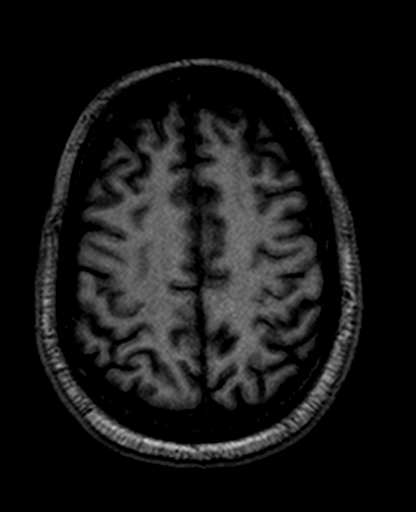
[im 64/74]
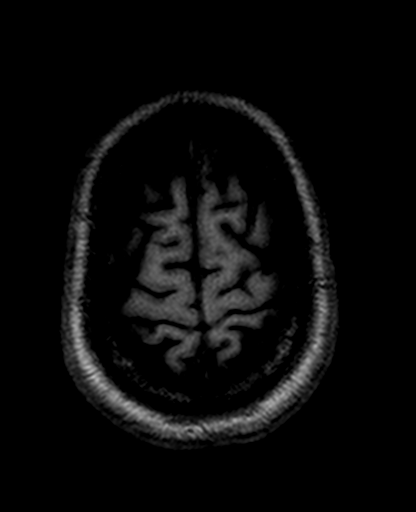

[Series 11: T2 · coronal · 5.0mm · 0.61mm/px · 3 of 28 slices shown (3 of 3)]
[im 1/28]
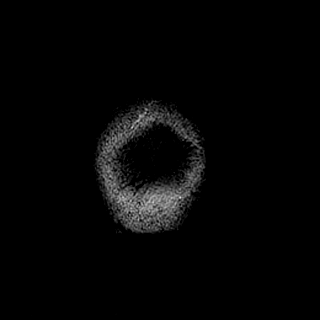
[im 14/28]
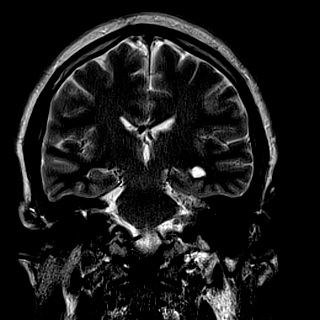
[im 28/28]
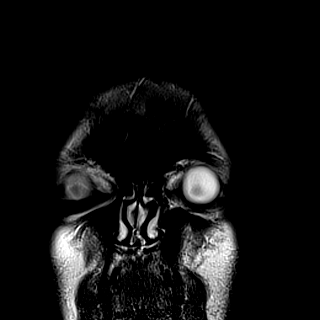

[35 of 48 positions shown; findings below may reference images not displayed]

FINDINGS: Brain: No acute infarction, hemorrhage, hydrocephalus, extra-axial
collection or mass lesion. No white matter disease or atrophy

Vascular: Major flow voids are preserved

Skull and upper cervical spine: Normal marrow signal.

Sinuses/Orbits: Negative.
IMPRESSION: Negative brain MRI.

## 2020-09-18 DIAGNOSIS — Z6838 Body mass index (BMI) 38.0-38.9, adult: Secondary | ICD-10-CM | POA: Diagnosis not present

## 2020-09-18 DIAGNOSIS — I10 Essential (primary) hypertension: Secondary | ICD-10-CM | POA: Diagnosis not present

## 2020-09-18 DIAGNOSIS — E1169 Type 2 diabetes mellitus with other specified complication: Secondary | ICD-10-CM | POA: Diagnosis not present

## 2020-09-18 DIAGNOSIS — E7849 Other hyperlipidemia: Secondary | ICD-10-CM | POA: Diagnosis not present

## 2020-12-19 DIAGNOSIS — E1169 Type 2 diabetes mellitus with other specified complication: Secondary | ICD-10-CM | POA: Diagnosis not present

## 2020-12-19 DIAGNOSIS — Z6837 Body mass index (BMI) 37.0-37.9, adult: Secondary | ICD-10-CM | POA: Diagnosis not present

## 2020-12-19 DIAGNOSIS — I10 Essential (primary) hypertension: Secondary | ICD-10-CM | POA: Diagnosis not present

## 2020-12-19 DIAGNOSIS — E7849 Other hyperlipidemia: Secondary | ICD-10-CM | POA: Diagnosis not present

## 2021-03-26 DIAGNOSIS — E1169 Type 2 diabetes mellitus with other specified complication: Secondary | ICD-10-CM | POA: Diagnosis not present

## 2021-05-22 ENCOUNTER — Encounter: Payer: Self-pay | Admitting: Internal Medicine

## 2021-07-08 DIAGNOSIS — Z0000 Encounter for general adult medical examination without abnormal findings: Secondary | ICD-10-CM | POA: Diagnosis not present

## 2021-09-22 NOTE — Progress Notes (Signed)
Referring Provider:  Toma Deiters, MD Primary Care Physician:  Toma Deiters, MD Primary Gastroenterologist:  Dr. Jena Gauss  Chief Complaint  Patient presents with   Colonoscopy    Has had 2 tcs's. Not having problems. Last tcs approx 10 yrs ago at [place]    HPI:   COSTA JHA is a 55 y.o. male presenting today at the request of  Hasanaj, Myra Gianotti, MD for consult colonoscopy.   Reviewed information included in referral.  Patient had office visit with PCP on 03/26/2021.  He was presenting for follow-up of diabetes.  Stated last hemoglobin A1c showed poor glycemic control.  He was advised to continue his current medications.  No mention of any GI problems or GI referral.   Today: Patient reports he is not having any problems.  Denies abdominal pain, constipation, diarrhea, BRBPR, melena, unintentional weight loss, nausea, vomiting, reflux symptoms, or dysphagia.  Reports he has had 2 colonoscopies in the past at [facility].  Does not think he has had any polyps.  Thinks his last colonoscopy was about 10 years ago.  Last A1 C was an 8. Daily blood glucose around 110-120   Past Medical History:  Diagnosis Date   Diabetes mellitus without complication (HCC)    Hypertension     Past Surgical History:  Procedure Laterality Date   COLONOSCOPY     [facility] SURGERY      Current Outpatient Medications  Medication Sig Dispense Refill   acarbose (PRECOSE) 100 MG tablet Take 100 mg by mouth 3 (three) times daily.     amLODipine (NORVASC) 5 MG tablet Take 5 mg by mouth every evening.     aspirin EC 81 MG tablet Take 81 mg by mouth daily. Swallow whole.     benazepril (LOTENSIN) 10 MG tablet Take 10 mg by mouth every evening.     hydrochlorothiazide (HYDRODIURIL) 25 MG tablet Take 25 mg by mouth every evening.     insulin lispro protamine-lispro (HUMALOG 75/25 MIX) (75-25) 100 UNIT/ML SUSP injection Inject 60 Units into the skin daily.     Omega-3 Fatty Acids (FISH  OIL PO) Take 1 capsule by mouth every evening.     vitamin B-12 (CYANOCOBALAMIN) 500 MCG tablet Take 1 tablet (500 mcg total) by mouth daily.     vitamin C (ASCORBIC ACID) 500 MG tablet Take 500 mg by mouth daily.     VITAMIN D, CHOLECALCIFEROL, PO Take by mouth daily.     No current facility-administered medications for this visit.    Allergies as of 09/24/2021   (No Known Allergies)    Family History  Problem Relation Age of Onset   Hypertension Mother    Diabetes Mother    Hypertension Father    Colon cancer Paternal Uncle        after age 58    Social History   Socioeconomic History   Marital status: Married    Spouse name: Not on file   Number of children: Not on file   Years of education: Not on file   Highest education level: Not on file  Occupational History   Not on file  Tobacco Use   Smoking status: Never   Smokeless tobacco: Never  Vaping Use   Vaping Use: Never used  Substance and Sexual Activity   Alcohol use: Never   Drug use: Never   Sexual activity: Not on file  Other Topics Concern   Not on file  Social History  Narrative   Not on file   Social Determinants of Health   Financial Resource Strain: Not on file  Food Insecurity: Not on file  Transportation Needs: Not on file  Physical Activity: Not on file  Stress: Not on file  Social Connections: Not on file  Intimate Partner Violence: Not on file    Review of Systems: Gen: Denies any fever, chills, cold or flulike symptoms, presyncope, syncope. CV: Denies chest pain, heart palpitations. Resp: Denies shortness of breath or cough. GI: See HPI GU : Denies urinary burning, urinary frequency, urinary hesitancy MS: Denies joint pain Derm: Denies rash Psych: Denies depression, anxiety Heme: See HPI  Physical Exam: BP 119/72   Pulse 69   Temp (!) 97.5 F (36.4 C) (Temporal)   Ht 5\' 4"  (1.626 m)   Wt 218 lb 12.8 oz (99.2 kg)   BMI 37.56 kg/m  General:   Alert and oriented. Pleasant and  cooperative. Well-nourished and well-developed.  Head:  Normocephalic and atraumatic. Eyes:  Without icterus, sclera clear and conjunctiva pink.  Ears:  Normal auditory acuity. Lungs:  Clear to auscultation bilaterally. No wheezes, rales, or rhonchi. No distress.  Heart:  S1, S2 present without murmurs appreciated.  Abdomen:  +BS, soft, non-tender and non-distended. No HSM noted. No guarding or rebound. No masses appreciated.  Rectal:  Deferred  Msk:  Symmetrical without gross deformities. Normal posture. Extremities:  Without edema. Neurologic:  Alert and  oriented x4;  grossly normal neurologically. Skin:  Intact without significant lesions or rashes. Psych:  Normal mood and affect.    Assessment: 55 year old male with history of diabetes and HTN, presenting today to discuss scheduling screening colonoscopy.  He has no significant upper or lower GI symptoms.  No alarm symptoms.  Reports he has had 2 colonoscopies in the past, both at [place], and without polyps.  Thinks his last colonoscopy was about 10 years ago.  Family history includes uncle with colon cancer greater than age 65.  Plan:  Request colonoscopy records from [facility].  Pending review, we will plan to schedule colonoscopy with [facility] if it is time. Follow-up PRN.

## 2021-09-24 ENCOUNTER — Encounter: Payer: Self-pay | Admitting: Gastroenterology

## 2021-09-24 ENCOUNTER — Other Ambulatory Visit: Payer: Self-pay

## 2021-09-24 ENCOUNTER — Ambulatory Visit (INDEPENDENT_AMBULATORY_CARE_PROVIDER_SITE_OTHER): Payer: BC Managed Care – PPO | Admitting: Gastroenterology

## 2021-09-24 VITALS — BP 119/72 | HR 69 | Temp 97.5°F | Ht 64.0 in | Wt 218.8 lb

## 2021-09-24 DIAGNOSIS — Z1211 Encounter for screening for malignant neoplasm of colon: Secondary | ICD-10-CM | POA: Diagnosis not present

## 2021-09-24 NOTE — Patient Instructions (Addendum)
I am requesting your colonoscopy records from [place].  Once I have reviewed these records, we will let you know if it is time to schedule your colonoscopy.    It was a pleasure meeting you today!  I hope you have a very Merry Christmas!  Ermalinda Memos, PA-C [facility]

## 2021-09-25 ENCOUNTER — Telehealth: Payer: Self-pay | Admitting: Gastroenterology

## 2021-09-25 NOTE — Telephone Encounter (Signed)
Received and reviewed colonoscopy records from [facility].  Procedure date: 05/04/2008. Endoscopist: Dr. Linna Darner Impression: No colonic neoplasms, no colonic or rectal polyps, no diverticulosis, normal colonoscopy. Recommendations: Repeat in 10 years.  [facility] clinical pool: Please let patient know I received and reviewed his colonoscopy records.  He is due for a colonoscopy.  Please arrange colonoscopy with conscious sedation with Dr. Jena Gauss. ASA 2.  1 day prior to procedure: Take one half dose of acarbose 3 times daily, one half dose of insulin (30 units). Day of procedure: Do not take any morning diabetes medications.  He will need to monitor his blood sugar closely while on clear liquids and correct any low blood sugar with improved sugary clear liquids.

## 2021-09-25 NOTE — Telephone Encounter (Signed)
LMOVM for pt to return call. Will call to schedule once we receive Dr. Luvenia Starch future schedule.

## 2021-10-08 ENCOUNTER — Telehealth: Payer: Self-pay | Admitting: *Deleted

## 2021-10-08 MED ORDER — PEG 3350-KCL-NA BICARB-NACL 420 G PO SOLR: ORAL | 0 refills | Status: DC

## 2021-10-08 NOTE — Telephone Encounter (Addendum)
Called pt to schedule TCS with Dr. Jena Gauss, conscious sedation, LMOVM

## 2021-10-08 NOTE — Addendum Note (Signed)
Addended by: Armstead Peaks on: 10/08/2021 11:03 AM   Modules accepted: Orders

## 2021-10-08 NOTE — Telephone Encounter (Signed)
Patient returned call. He has been scheduled for 1/11 at 7:30am. Aware will mail prep instructions and will send Rx to pharmacy. Confirmed both. Also confirmed he will have same insurance.

## 2021-11-05 ENCOUNTER — Other Ambulatory Visit: Payer: Self-pay

## 2021-11-05 ENCOUNTER — Ambulatory Visit (HOSPITAL_COMMUNITY)
Admission: RE | Admit: 2021-11-05 | Discharge: 2021-11-05 | Disposition: A | Discharge status: Home / Self Care | Payer: BC Managed Care – PPO | Attending: Internal Medicine | Admitting: Internal Medicine

## 2021-11-05 ENCOUNTER — Encounter (HOSPITAL_COMMUNITY): Payer: Self-pay | Admitting: Internal Medicine

## 2021-11-05 ENCOUNTER — Encounter (HOSPITAL_COMMUNITY)
Admission: RE | Disposition: A | Discharge status: Home / Self Care | Payer: Self-pay | Source: Home / Self Care | Attending: Internal Medicine

## 2021-11-05 DIAGNOSIS — Z800 Family history of malignant neoplasm of digestive organs: Secondary | ICD-10-CM | POA: Insufficient documentation

## 2021-11-05 DIAGNOSIS — Z1211 Encounter for screening for malignant neoplasm of colon: Secondary | ICD-10-CM | POA: Diagnosis not present

## 2021-11-05 DIAGNOSIS — K5730 Diverticulosis of large intestine without perforation or abscess without bleeding: Secondary | ICD-10-CM | POA: Diagnosis not present

## 2021-11-05 HISTORY — PX: COLONOSCOPY: SHX5424

## 2021-11-05 LAB — GLUCOSE, CAPILLARY: Glucose-Capillary: 161 mg/dL — ABNORMAL HIGH (ref 70–99)

## 2021-11-05 SURGERY — COLONOSCOPY
Anesthesia: Moderate Sedation

## 2021-11-05 MED ORDER — MEPERIDINE HCL 100 MG/ML IJ SOLN
INTRAMUSCULAR | Status: DC | PRN
  Administered 2021-11-05: 40 mg via INTRAVENOUS

## 2021-11-05 MED ORDER — MIDAZOLAM HCL 5 MG/5ML IJ SOLN
INTRAMUSCULAR | Status: AC
  Filled 2021-11-05: qty 10

## 2021-11-05 MED ORDER — ONDANSETRON HCL 4 MG/2ML IJ SOLN
INTRAMUSCULAR | Status: DC | PRN
  Administered 2021-11-05: 4 mg via INTRAVENOUS

## 2021-11-05 MED ORDER — ONDANSETRON HCL 4 MG/2ML IJ SOLN
INTRAMUSCULAR | Status: AC
  Filled 2021-11-05: qty 2

## 2021-11-05 MED ORDER — MEPERIDINE HCL 50 MG/ML IJ SOLN
INTRAMUSCULAR | Status: AC
  Filled 2021-11-05: qty 1

## 2021-11-05 MED ORDER — MIDAZOLAM HCL 5 MG/5ML IJ SOLN
INTRAMUSCULAR | Status: DC | PRN
  Administered 2021-11-05: 1 mg via INTRAVENOUS
  Administered 2021-11-05: 2 mg via INTRAVENOUS
  Administered 2021-11-05: 1 mg via INTRAVENOUS

## 2021-11-05 MED ORDER — SODIUM CHLORIDE 0.9 % IV SOLN: INTRAVENOUS | Status: DC

## 2021-11-05 NOTE — Op Note (Signed)
[facility] Patient Name: Keith Best Procedure Date: 11/05/2021 7:18 AM MRN: [id number] Date of Birth: [birth year] Attending MD: Gennette Pac , MD CSN: [id number] Age: 56 Admit Type: Outpatient Procedure:                Colonoscopy Indications:              Screening for colorectal malignant neoplasm Providers:                Gennette Pac, MD, Nena Polio, RN, Durwin Glaze Tech, Technician Referring MD:              Medicines:                Midazolam 5 mg IV, Meperidine 40 mg IV Complications:            No immediate complications. Estimated Blood Loss:     Estimated blood loss: none. Procedure:                Pre-Anesthesia Assessment:                           - Prior to the procedure, a History and Physical                            was performed, and patient medications and                            allergies were reviewed. The patient's tolerance of                            previous anesthesia was also reviewed. The risks                            and benefits of the procedure and the sedation                            options and risks were discussed with the patient.                            All questions were answered, and informed consent                            was obtained. Prior Anticoagulants: The patient has                            taken no previous anticoagulant or antiplatelet                            agents. ASA Grade Assessment: II - A patient with                            mild systemic disease. After reviewing the risks  and benefits, the patient was deemed in                            satisfactory condition to undergo the procedure.                           After obtaining informed consent, the colonoscope                            was passed under direct vision. Throughout the                            procedure, the patient's blood pressure, pulse, and                             oxygen saturations were monitored continuously. The                            [id number]) scope was introduced through the                            anus and advanced to the the cecum, identified by                            appendiceal orifice and ileocecal valve. The                            colonoscopy was performed without difficulty. The                            patient tolerated the procedure well. The quality                            of the bowel preparation was adequate. Scope In: 7:50:06 AM Scope Out: 8:02:34 AM Scope Withdrawal Time: 0 hours 8 minutes 56 seconds  Total Procedure Duration: 0 hours 12 minutes 28 seconds  Findings:      The perianal and digital rectal examinations were normal.      Scattered small-mouthed diverticula were found in the sigmoid colon.      The exam was otherwise without abnormality on direct and retroflexion       views. Impression:               - Diverticulosis in the sigmoid colon.                           - The examination was otherwise normal on direct                            and retroflexion views.                           - No specimens collected. Moderate Sedation:      Moderate (conscious) sedation was administered by the endoscopy nurse       and supervised by the endoscopist. The following parameters were  monitored: oxygen saturation, heart rate, blood pressure, respiratory       rate, EKG, adequacy of pulmonary ventilation, and response to care.       Total physician intraservice time was 21 minutes. Recommendation:           - Patient has a contact number available for                            emergencies. The signs and symptoms of potential                            delayed complications were discussed with the                            patient. Return to normal activities tomorrow.                            Written discharge instructions were provided to the                             patient.                           - Advance diet as tolerated. Repeat colonoscopy in                            10 years for screening purposes. Procedure Code(s):        --- Professional ---                           [phone], Colonoscopy, flexible; diagnostic, including                            collection of specimen(s) by brushing or washing,                            when performed (separate procedure)                           G0500, Moderate sedation services provided by the                            same physician or other qualified health care                            professional performing a gastrointestinal                            endoscopic service that sedation supports,                            requiring the presence of an independent trained                            observer to assist in the monitoring of the  patient's level of consciousness and physiological                            status; initial 15 minutes of intra-service time;                            patient age 92 years or older (additional time may                            be reported with [id number], as appropriate) Diagnosis Code(s):        --- Professional ---                           Z12.11, Encounter for screening for malignant                            neoplasm of colon                           K57.30, Diverticulosis of large intestine without                            perforation or abscess without bleeding CPT copyright 2019 [employer]. All rights reserved. The codes documented in this report are preliminary and upon coder review may  be revised to meet current compliance requirements. Gerrit Friendsobert M. Keana Dueitt, MD Gennette Pacobert Michael Shaleena Crusoe, MD 11/05/2021 8:23:37 AM This report has been signed electronically. Number of Addenda: 0

## 2021-11-05 NOTE — Discharge Instructions (Addendum)
°  Colonoscopy Discharge Instructions  Read the instructions outlined below and refer to this sheet in the next few weeks. These discharge instructions provide you with general information on caring for yourself after you leave the hospital. Your doctor may also give you specific instructions. While your treatment has been planned according to the most current medical practices available, unavoidable complications occasionally occur. If you have any problems or questions after discharge, call Dr. Jena Gauss at [phone]. ACTIVITY You may resume your regular activity, but move at a slower pace for the next 24 hours.  Take frequent rest periods for the next 24 hours.  Walking will help get rid of the air and reduce the bloated feeling in your belly (abdomen).  No driving for 24 hours (because of the medicine (anesthesia) used during the test).   Do not sign any important legal documents or operate any machinery for 24 hours (because of the anesthesia used during the test).  NUTRITION Drink plenty of fluids.  You may resume your normal diet as instructed by your doctor.  Begin with a light meal and progress to your normal diet. Heavy or fried foods are harder to digest and may make you feel sick to your stomach (nauseated).  Avoid alcoholic beverages for 24 hours or as instructed.  MEDICATIONS You may resume your normal medications unless your doctor tells you otherwise.  WHAT YOU CAN EXPECT TODAY Some feelings of bloating in the abdomen.  Passage of more gas than usual.  Spotting of blood in your stool or on the toilet paper.  IF YOU HAD POLYPS REMOVED DURING THE COLONOSCOPY: No aspirin products for 7 days or as instructed.  No alcohol for 7 days or as instructed.  Eat a soft diet for the next 24 hours.  FINDING OUT THE RESULTS OF YOUR TEST Not all test results are available during your visit. If your test results are not back during the visit, make an appointment with your caregiver to find out the  results. Do not assume everything is normal if you have not heard from your caregiver or the medical facility. It is important for you to follow up on all of your test results.  SEEK IMMEDIATE MEDICAL ATTENTION IF: You have more than a spotting of blood in your stool.  Your belly is swollen (abdominal distention).  You are nauseated or vomiting.  You have a temperature over 101.  You have abdominal pain or discomfort that is severe or gets worse throughout the day.    No polyps found today  Mild diverticulosis  Diverticulosis information provided     Repeat colonoscopy for screening purposes in 10 years  At patient request, I called Leonie Douglas at [phone]-reviewed findings and recommendations

## 2021-11-05 NOTE — H&P (Addendum)
@LOGO @   Primary Care Physician:  , MD Primary Gastroenterologist:  Dr. Toma Deiters  Pre-Procedure History & Physical: HPI:  Keith Best is a 56 y.o. male is here for a screening colonoscopy.  No bowel symptoms; paternal uncle reportedly with colon cancer at age 86 .  Patient reports colonoscopy in the distant past x2 for rectal bleeding without significant findings.  Denies prior history of polyps.  Past Medical History:  Diagnosis Date   Diabetes mellitus without complication (HCC)    Hypertension     Past Surgical History:  Procedure Laterality Date   COLONOSCOPY     [facility]   SHOULDER SURGERY      Prior to Admission medications   Medication Sig Start Date End Date Taking? Authorizing Provider  acarbose (PRECOSE) 100 MG tablet Take 100 mg by mouth 3 (three) times daily.   Yes [provider]  amLODipine (NORVASC) 5 MG tablet Take 5 mg by mouth every evening.   Yes [provider]  aspirin EC 81 MG tablet Take 81 mg by mouth daily. Swallow whole.   Yes [provider]  benazepril (LOTENSIN) 10 MG tablet Take 10 mg by mouth every evening.   Yes [provider]  Cinnamon 500 MG capsule Take 500 mg by mouth daily.   Yes [provider]  hydrochlorothiazide (HYDRODIURIL) 25 MG tablet Take 25 mg by mouth every evening.   Yes [provider]  insulin lispro protamine-lispro (HUMALOG 75/25 MIX) (75-25) 100 UNIT/ML SUSP injection Inject 60 Units into the skin daily.   Yes [provider]  polyethylene glycol-electrolytes (NULYTELY) 420 g solution As directed 10/08/21  Yes Devetta Hagenow, 10/10/21, MD  vitamin B-12 (CYANOCOBALAMIN) 500 MCG tablet Take 1 tablet (500 mcg total) by mouth daily. 04/17/19  Yes Tat, 04/19/19, MD  vitamin C (ASCORBIC ACID) 500 MG tablet Take 500 mg by mouth daily.   Yes [provider]  Vitamin D, Cholecalciferol, 25 MCG (1000 UT) TABS Take 1,000 Units by mouth daily.   Yes [provider]    Allergies as of 10/08/2021   (No Known Allergies)    Family History  Problem Relation Age of Onset   Hypertension Mother    Diabetes Mother    Hypertension Father    Colon cancer Paternal Uncle        after age 26    Social History   Socioeconomic History   Marital status: Married    Spouse name: Not on file   Number of children: Not on file   Years of education: Not on file   Highest education level: Not on file  Occupational History   Not on file  Tobacco Use   Smoking status: Never   Smokeless tobacco: Never  Vaping Use   Vaping Use: Never used  Substance and Sexual Activity   Alcohol use: Never   Drug use: Never   Sexual activity: Not on file  Other Topics Concern   Not on file  Social History Narrative   Not on file   Social Determinants of Health   Financial Resource Strain: Not on file  Food Insecurity: Not on file  Transportation Needs: Not on file  Physical Activity: Not on file  Stress: Not on file  Social Connections: Not on file  Intimate Partner Violence: Not on file    Review of Systems: See HPI, otherwise negative ROS  Physical Exam: BP 131/71    Pulse 75    Temp 98.1  F (36.7 C) (Oral)    Resp 17    Ht 5\' 4"  (1.626 m)    Wt 96.2 kg    SpO2 97%    BMI 36.39 kg/m  General:   Alert,  Well-developed, well-nourished, pleasant and cooperative in NAD ormal. Neck:  Supple; no masses or thyromegaly. Lungs:  Clear throughout to auscultation.   No wheezes, crackles, or rhonchi. No acute distress. Heart:  Regular rate and rhythm; no murmurs, clicks, rubs,  or gallops. Abdomen:  Soft, nontender and nondistended. No masses, hepatosplenomegaly or hernias noted. Normal bowel sounds, without guarding, and without rebound.   Impression/Plan: Keith Best is now here to undergo a screening colonoscopy.  Average risk  Risks, benefits, limitations, imponderables and alternatives regarding colonoscopy have been reviewed with the  patient. Questions have been answered. All parties agreeable.     Notice:  This dictation was prepared with Dragon dictation along with smaller phrase technology. Any transcriptional errors that result from this process are unintentional and may not be corrected upon review.

## 2021-11-07 ENCOUNTER — Encounter (HOSPITAL_COMMUNITY): Payer: Self-pay | Admitting: Internal Medicine

## 2021-11-27 DIAGNOSIS — E1169 Type 2 diabetes mellitus with other specified complication: Secondary | ICD-10-CM | POA: Diagnosis not present

## 2022-02-03 DIAGNOSIS — S233XXA Sprain of ligaments of thoracic spine, initial encounter: Secondary | ICD-10-CM | POA: Diagnosis not present

## 2022-03-02 DIAGNOSIS — E1169 Type 2 diabetes mellitus with other specified complication: Secondary | ICD-10-CM | POA: Diagnosis not present

## 2022-03-02 DIAGNOSIS — Z6835 Body mass index (BMI) 35.0-35.9, adult: Secondary | ICD-10-CM | POA: Diagnosis not present

## 2022-03-02 DIAGNOSIS — E7849 Other hyperlipidemia: Secondary | ICD-10-CM | POA: Diagnosis not present

## 2022-03-02 DIAGNOSIS — I10 Essential (primary) hypertension: Secondary | ICD-10-CM | POA: Diagnosis not present

## 2022-06-04 DIAGNOSIS — E1169 Type 2 diabetes mellitus with other specified complication: Secondary | ICD-10-CM | POA: Diagnosis not present

## 2022-06-04 DIAGNOSIS — I10 Essential (primary) hypertension: Secondary | ICD-10-CM | POA: Diagnosis not present

## 2022-06-04 DIAGNOSIS — Z6836 Body mass index (BMI) 36.0-36.9, adult: Secondary | ICD-10-CM | POA: Diagnosis not present

## 2022-06-04 DIAGNOSIS — E7849 Other hyperlipidemia: Secondary | ICD-10-CM | POA: Diagnosis not present

## 2022-09-03 DIAGNOSIS — Z0000 Encounter for general adult medical examination without abnormal findings: Secondary | ICD-10-CM | POA: Diagnosis not present

## 2022-09-03 DIAGNOSIS — Z6836 Body mass index (BMI) 36.0-36.9, adult: Secondary | ICD-10-CM | POA: Diagnosis not present

## 2022-09-21 ENCOUNTER — Encounter: Payer: Self-pay | Admitting: Internal Medicine

## 2022-10-27 ENCOUNTER — Encounter: Payer: Self-pay | Admitting: Internal Medicine

## 2022-10-27 ENCOUNTER — Ambulatory Visit (INDEPENDENT_AMBULATORY_CARE_PROVIDER_SITE_OTHER): Payer: BC Managed Care – PPO | Admitting: Internal Medicine

## 2022-10-27 VITALS — BP 118/76 | HR 60 | Temp 98.0°F | Ht 63.0 in | Wt 211.4 lb

## 2022-10-27 DIAGNOSIS — R194 Change in bowel habit: Secondary | ICD-10-CM

## 2022-10-27 NOTE — Patient Instructions (Signed)
It was good to see you again today!  Happy new year!  Your symptoms of increased mucus in the stool are nonspecific.  You may have had a little gastroenteritis symptoms or may be due to something in your diet.  I am glad they have resolved.  Lialda or mesalamine probably not making a difference.  This medication should be stopped  We know you have diverticulosis from your colonoscopy 1 year ago.  I recommend you start a daily fiber supplement ( Benefiber 1 tablespoon daily).  Take every day where the feel you needed or not  No testing is needed at this time.  Office visit with me in 3 months  If you have any problems between now and your follow-up appointment, please let me know.

## 2022-10-27 NOTE — Progress Notes (Unsigned)
Primary Care Physician:  Neale Burly, MD Primary Gastroenterologist:  Dr. Gala Romney  Pre-Procedure History & Physical: HPI:  Keith Best is a 57 y.o. male here for concern of a 3-week history of mucus in stool noting mucus with otherwise normal bowel movements on the order of 2 twice daily.  No notable rectal bleeding.  No diarrhea.  No abdominal pain no nausea vomiting or other upper GI tract symptoms.  Seem to have self-limiting process things resolved.  Saw Dr. Blanch Media.  He was started on Lialda 1.2 g twice daily.  He took it for a while on an as-needed basis.  Back to baseline 1-2 formed bowel movements daily.  Not seeing any "mucus".  Ate very well over the holidays recently.  Colonoscopy for screening 1 year ago demonstrated diverticulosis; and year screening.  Past Medical History:  Diagnosis Date   Diabetes mellitus without complication ([place])    Hypertension     Past Surgical History:  Procedure Laterality Date   COLONOSCOPY     [facility]   COLONOSCOPY N/A 11/05/2021   Procedure: COLONOSCOPY;  Surgeon: Daneil Dolin, MD;  Location: [facility];  Service: Endoscopy;  Laterality: N/A;  7:30am   SHOULDER SURGERY      Prior to Admission medications   Medication Sig Start Date End Date Taking? Authorizing Provider  amLODipine (NORVASC) 5 MG tablet Take 5 mg by mouth every evening.   Yes [provider]  aspirin EC 81 MG tablet Take 81 mg by mouth daily. Swallow whole.   Yes [provider]  atorvastatin (LIPITOR) 20 MG tablet Take 20 mg by mouth daily. 08/31/22  Yes [provider]  benazepril (LOTENSIN) 10 MG tablet Take 10 mg by mouth every evening.   Yes [provider]  hydrochlorothiazide (HYDRODIURIL) 25 MG tablet Take 25 mg by mouth every evening.   Yes [provider]  insulin lispro protamine-lispro (HUMALOG 75/25 MIX) (75-25) 100 UNIT/ML SUSP injection Inject 60 Units into the skin daily.   Yes [provider]  mesalamine (LIALDA) 1.2 g EC tablet Take 2.4 g by mouth daily. 08/22/22  Yes [provider]  vitamin B-12 (CYANOCOBALAMIN) 500 MCG tablet Take 1 tablet (500 mcg total) by mouth daily. 04/17/19  Yes Tat, Shanon Brow, MD  vitamin C (ASCORBIC ACID) 500 MG tablet Take 500 mg by mouth daily.   Yes [provider]  Vitamin D, Cholecalciferol, 25 MCG (1000 UT) TABS Take 1,000 Units by mouth daily.   Yes [provider]    Allergies as of 10/27/2022   (No Known Allergies)    Family History  Problem Relation Age of Onset   Hypertension Mother    Diabetes Mother    Hypertension Father    Colon cancer Paternal Uncle        after age 60    Social History   Socioeconomic History   Marital status: Married    Spouse name: Not on file   Number of children: Not on file   Years of education: Not on file   Highest education level: Not on file  Occupational History   Not on file  Tobacco Use   Smoking status: Never   Smokeless tobacco: Never  Vaping Use   Vaping Use: Never used  Substance and Sexual Activity   Alcohol use: Never   Drug use: Never   Sexual activity: Not Currently  Other Topics Concern   Not on file  Social History Narrative  Not on file   Social Determinants of Health   Financial Resource Strain: Not on file  Food Insecurity: Not on file  Transportation Needs: Not on file  Physical Activity: Not on file  Stress: Not on file  Social Connections: Not on file  Intimate Partner Violence: Not on file    Review of Systems: See HPI, otherwise negative ROS  Physical Exam: BP 118/76 (BP Location: Right Arm, Patient Position: Sitting, Cuff Size: Large)   Pulse 60   Temp 98 F (36.7 C) (Oral)   Ht 5\' 3"  (1.6 m)   Wt 211 lb 6.4 oz (95.9 kg)   SpO2 96%   BMI 37.45 kg/m  General:   Alert,  Well-developed, well-nourished, pleasant and cooperative in NAD SNeck:  Supple; no masses or thyromegaly. No significant cervical  adenopathy. Lungs:  Clear throughout to auscultation.   No wheezes, crackles, or rhonchi. No acute distress. Heart:  Regular rate and rhythm; no murmurs, clicks, rubs,  or gallops. Abdomen: Non-distended, normal bowel sounds.  Soft and nontender without appreciable mass or hepatosplenomegaly.  Pulses:  Normal pulses noted. Extremities:  Without clubbing or edema.  Impression/Plan: 57 year old gentleman with a complaint of increased mucus in his stool self-limiting for about 3 weeks recently.  No other GI symptoms.  Symptoms resolved.  Already on low-dose mesalamine which she has not been taking on a regular basis. At this time, he is having no GI symptoms.  History of diverticulosis.  Symptoms of increased mucus in the stool are nonspecific and not necessarily a sign of colitis or other pathological process. Good to know that he is up-to-date on colorectal cancer screening.  Recommendations  Lialda or mesalamine probably not making a difference.  This medication should be stopped  We know you have diverticulosis from your colonoscopy 1 year ago.  I recommend you start a daily fiber supplement ( Benefiber 1 tablespoon daily).  Take every day where the feel you needed or not  No testing is needed at this time.  Office visit with me in 3 months  If you have any problems between now and your follow-up appointment, please let me know.   Notice: This dictation was prepared with Dragon dictation along with smaller phrase technology. Any transcriptional errors that result from this process are unintentional and may not be corrected upon review.

## 2022-12-09 DIAGNOSIS — E7849 Other hyperlipidemia: Secondary | ICD-10-CM | POA: Diagnosis not present

## 2022-12-09 DIAGNOSIS — I10 Essential (primary) hypertension: Secondary | ICD-10-CM | POA: Diagnosis not present

## 2022-12-09 DIAGNOSIS — Z6836 Body mass index (BMI) 36.0-36.9, adult: Secondary | ICD-10-CM | POA: Diagnosis not present

## 2022-12-09 DIAGNOSIS — E1169 Type 2 diabetes mellitus with other specified complication: Secondary | ICD-10-CM | POA: Diagnosis not present

## 2022-12-28 ENCOUNTER — Encounter: Payer: Self-pay | Admitting: Internal Medicine

## 2023-03-10 DIAGNOSIS — E1169 Type 2 diabetes mellitus with other specified complication: Secondary | ICD-10-CM | POA: Diagnosis not present

## 2023-03-10 DIAGNOSIS — E7849 Other hyperlipidemia: Secondary | ICD-10-CM | POA: Diagnosis not present

## 2023-03-10 DIAGNOSIS — N400 Enlarged prostate without lower urinary tract symptoms: Secondary | ICD-10-CM | POA: Diagnosis not present

## 2023-03-10 DIAGNOSIS — I10 Essential (primary) hypertension: Secondary | ICD-10-CM | POA: Diagnosis not present

## 2023-06-17 DIAGNOSIS — N400 Enlarged prostate without lower urinary tract symptoms: Secondary | ICD-10-CM | POA: Diagnosis not present

## 2023-06-17 DIAGNOSIS — E7849 Other hyperlipidemia: Secondary | ICD-10-CM | POA: Diagnosis not present

## 2023-06-17 DIAGNOSIS — I10 Essential (primary) hypertension: Secondary | ICD-10-CM | POA: Diagnosis not present

## 2023-06-17 DIAGNOSIS — E1169 Type 2 diabetes mellitus with other specified complication: Secondary | ICD-10-CM | POA: Diagnosis not present

## 2023-08-20 DIAGNOSIS — H40033 Anatomical narrow angle, bilateral: Secondary | ICD-10-CM | POA: Diagnosis not present

## 2023-08-20 DIAGNOSIS — E119 Type 2 diabetes mellitus without complications: Secondary | ICD-10-CM | POA: Diagnosis not present

## 2023-09-16 DIAGNOSIS — Z6835 Body mass index (BMI) 35.0-35.9, adult: Secondary | ICD-10-CM | POA: Diagnosis not present

## 2023-09-16 DIAGNOSIS — Z0000 Encounter for general adult medical examination without abnormal findings: Secondary | ICD-10-CM | POA: Diagnosis not present

## 2023-12-16 DIAGNOSIS — E7849 Other hyperlipidemia: Secondary | ICD-10-CM | POA: Diagnosis not present

## 2023-12-16 DIAGNOSIS — M62830 Muscle spasm of back: Secondary | ICD-10-CM | POA: Diagnosis not present

## 2023-12-16 DIAGNOSIS — I10 Essential (primary) hypertension: Secondary | ICD-10-CM | POA: Diagnosis not present

## 2023-12-16 DIAGNOSIS — E1122 Type 2 diabetes mellitus with diabetic chronic kidney disease: Secondary | ICD-10-CM | POA: Diagnosis not present

## 2024-03-02 NOTE — H&P (View-Only) (Signed)
 Referring Provider: Veda Gerald, MD Primary Care Physician:  Veda Gerald, MD Primary GI Physician: Dr. Riley Cheadle  Chief Complaint  Patient presents with   Rectal Bleeding    Having issues with rectal bleeding. States that it isn't all the time. States that every now and then it flares up and he has this problem.     HPI:   Keith Best is a 58 y.o. male presenting today with complaint of rectal bleeding.   Colonoscopy 11/05/21: Sigmoid colon diverticulosis, otherwise normal exam.  Recommended 10-year screening.  Patient last seen in the office 10/27/2022.  Reported having a 3-week course of mucus in his stool with otherwise normal bowel movements twice per day.  No rectal bleeding, diarrhea, abdominal pain, nausea, vomiting, or any other GI symptoms.  Dr. Hassani had started him on Lialda 1.2 g twice a day which she took for a while on an as-needed basis.  By the time of his office visit, mucus in the stool had resolved.  Suspected benign, self-limiting process.  Recommended stopping Lialda.  Recommended starting Benefiber.  Follow-up in 3 months.  Today:  Occasional flares of rectal bleeding and mucus in his stool.  Blood to be on toilet tissue and in the toilet water, typically with the first bowel movement of the day.  After that, he just has mucus in his stool.  During these flares, he does have increased bowel frequency with 2-3 bowel movements per day, but stools are still formed.  When is not dealing with 1 of these flares, he has 1 bowel movement per day.  Current flare has been going on for about 1 month now. No abdominal pain, rectal pain, hemorrhoids, unintentional weight loss.   This has been an intermittent issues for a while now. Will go several months between episodes.  Once an episode starts, it last several weeks.  No identified triggers.  Denies NSAIDs, EtOH, illicit drug use, or tobacco use.  Dr. Lorraine Roses prescribed lialda in the past which seemed to help, but Dr.  Riley Cheadle advised that he stop the medication. Did well for a while. States Dr. Lorraine Roses was also prescribed prednisone which has cleared his symptoms.  About 2 weeks ago, he was again prescribed Lialda.  Took it for 1 week, but had no improvement in symptoms, so Dr. Lorraine Roses advised that he stop the medication and see GI.   Reports no family history of colon cancer though history has documented paternal uncle with colon cancer after age 36.  No family history of IBD.   Past Medical History:  Diagnosis Date   Diabetes mellitus without complication (HCC)    Hypertension     Past Surgical History:  Procedure Laterality Date   COLONOSCOPY     [facility]   COLONOSCOPY N/A 11/05/2021   ;  Surgeon: Suzette Espy, MD;  Sigmoid colon diverticulosis, otherwise normal exam.  Recommended 10-year screening.   SHOULDER SURGERY      Current Outpatient Medications  Medication Sig Dispense Refill   alfuzosin (UROXATRAL) 10 MG 24 hr tablet Take 10 mg by mouth daily.     amLODipine  (NORVASC ) 5 MG tablet Take 5 mg by mouth every evening.     aspirin EC 81 MG tablet Take 81 mg by mouth daily. Swallow whole.     atorvastatin (LIPITOR) 20 MG tablet Take 20 mg by mouth daily.     benazepril  (LOTENSIN ) 10 MG tablet Take 10 mg by mouth every evening.  insulin  lispro protamine-lispro (HUMALOG 75/25 MIX) (75-25) 100 UNIT/ML SUSP injection Inject 60 Units into the skin daily.     JARDIANCE 25 MG TABS tablet Take 25 mg by mouth daily.     OZEMPIC, 1 MG/DOSE, 4 MG/3ML SOPN Inject 1 mg into the skin once a week.     polyethylene glycol-electrolytes (NULYTELY) 420 g solution Take 4,000 mLs by mouth once for 1 dose. 4000 mL 0   vitamin B-12 (CYANOCOBALAMIN ) 500 MCG tablet Take 1 tablet (500 mcg total) by mouth daily.     vitamin C (ASCORBIC ACID) 500 MG tablet Take 500 mg by mouth daily.     Vitamin D, Cholecalciferol, 25 MCG (1000 UT) TABS Take 1,000 Units by mouth daily.     No current facility-administered  medications for this visit.    Allergies as of 03/03/2024   (No Known Allergies)    Family History  Problem Relation Age of Onset   Hypertension Mother    Diabetes Mother    Hypertension Father    Colon cancer Paternal Uncle        after age 56   Inflammatory bowel disease Neg Hx     Social History   Socioeconomic History   Marital status: Married    Spouse name: Not on file   Number of children: Not on file   Years of education: Not on file   Highest education level: Not on file  Occupational History   Not on file  Tobacco Use   Smoking status: Never   Smokeless tobacco: Never  Vaping Use   Vaping status: Never Used  Substance and Sexual Activity   Alcohol use: Never   Drug use: Never   Sexual activity: Not Currently  Other Topics Concern   Not on file  Social History Narrative   Not on file   Social Drivers of Health   Financial Resource Strain: Not on file  Food Insecurity: Not on file  Transportation Needs: Not on file  Physical Activity: Not on file  Stress: Not on file  Social Connections: Not on file    Review of Systems: Gen: Denies fever, chills, cold or flulike symptoms, presyncope, syncope. CV: Denies chest pain, palpitations.  Resp: Denies dyspnea, cough.  GI: See HPI Heme: See HPI  Physical Exam: BP 100/65 (BP Location: Right Arm, Patient Position: Sitting, Cuff Size: Large)   Pulse 83   Temp 98.3 F (36.8 C) (Oral)   Ht 5\' 2"  (1.575 m)   Wt 205 lb 12.8 oz (93.4 kg)   SpO2 95%   BMI 37.64 kg/m  General:   Alert and oriented. No distress noted. Pleasant and cooperative.  Head:  Normocephalic and atraumatic. Eyes:  Conjuctiva clear without scleral icterus. Heart:  S1, S2 present without murmurs appreciated. Lungs:  Clear to auscultation bilaterally. No wheezes, rales, or rhonchi. No distress.  Abdomen:  +BS, soft, non-tender and non-distended. No rebound or guarding. No HSM or masses noted. Rectal: No external or internal  abnormalities appreciated. No gross blood.  Msk:  Symmetrical without gross deformities. Normal posture. Extremities:  Without edema. Neurologic:  Alert and  oriented x4 Psych:  Normal mood and affect.    Assessment:  58 year old male with history of diabetes, HTN, HLD, presenting today for further evaluation of rectal bleeding and mucus in his stool.  Symptoms have been occurring intermittently for more than a year now going several months between episodes, but when symptoms occur, they last for several weeks.  PCP has prescribed  Lialda and prednisone in the past for possible colitis.  Interestingly, patient reports these medications have helped him.  He has no associated abdominal pain, nausea, vomiting, diarrhea.  He does have increased bowel frequency with 2-3 bowel movements per day during these episodes, but stools remain formed.  No first degree relative with colon cancer and no Fhx of IBD.  Most recent colonoscopy in January 2023 showed sigmoid colon diverticulosis, otherwise normal exam.  Rectal exam today with no abnormalities appreciated.  Etiology of his symptoms are not clear, but as he is having rectal bleeding, I have recommended updating his colonoscopy with deep intubation of IT with biopsies to rule out IBD.  I will also update CBC check CRP and fecal calprotectin.    Plan:  CBC, CRP, fecal calprotectin Proceed with colonoscopy with propofol by Dr. Riley Cheadle in near future. The risks, benefits, and alternatives have been discussed with the patient in detail. The patient states understanding and desires to proceed.  ASA 2 Hold Ozempic x 1 week prior Hold Jardiance x 3 days prior 1 day prior: One half dose of insulin  at bedtime Day of: No morning diabetes medications Follow-up after colonoscopy.   Shana Daring, PA-C [facility] 03/03/2024

## 2024-03-02 NOTE — Progress Notes (Signed)
 Referring Provider: Veda Gerald, MD Primary Care Physician:  Veda Gerald, MD Primary GI Physician: Dr. Riley Cheadle  Chief Complaint  Patient presents with   Rectal Bleeding    Having issues with rectal bleeding. States that it isn't all the time. States that every now and then it flares up and he has this problem.     HPI:   Keith Best is a 58 y.o. male presenting today with complaint of rectal bleeding.   Colonoscopy 11/05/21: Sigmoid colon diverticulosis, otherwise normal exam.  Recommended 10-year screening.  Patient last seen in the office 10/27/2022.  Reported having a 3-week course of mucus in his stool with otherwise normal bowel movements twice per day.  No rectal bleeding, diarrhea, abdominal pain, nausea, vomiting, or any other GI symptoms.  Dr. Hassani had started him on Lialda 1.2 g twice a day which she took for a while on an as-needed basis.  By the time of his office visit, mucus in the stool had resolved.  Suspected benign, self-limiting process.  Recommended stopping Lialda.  Recommended starting Benefiber.  Follow-up in 3 months.  Today:  Occasional flares of rectal bleeding and mucus in his stool.  Blood to be on toilet tissue and in the toilet water, typically with the first bowel movement of the day.  After that, he just has mucus in his stool.  During these flares, he does have increased bowel frequency with 2-3 bowel movements per day, but stools are still formed.  When is not dealing with 1 of these flares, he has 1 bowel movement per day.  Current flare has been going on for about 1 month now. No abdominal pain, rectal pain, hemorrhoids, unintentional weight loss.   This has been an intermittent issues for a while now. Will go several months between episodes.  Once an episode starts, it last several weeks.  No identified triggers.  Denies NSAIDs, EtOH, illicit drug use, or tobacco use.  Dr. Lorraine Roses prescribed lialda in the past which seemed to help, but Dr.  Riley Cheadle advised that he stop the medication. Did well for a while. States Dr. Lorraine Roses was also prescribed prednisone which has cleared his symptoms.  About 2 weeks ago, he was again prescribed Lialda.  Took it for 1 week, but had no improvement in symptoms, so Dr. Lorraine Roses advised that he stop the medication and see GI.   Reports no family history of colon cancer though history has documented paternal uncle with colon cancer after age 36.  No family history of IBD.   Past Medical History:  Diagnosis Date   Diabetes mellitus without complication (HCC)    Hypertension     Past Surgical History:  Procedure Laterality Date   COLONOSCOPY     [facility]   COLONOSCOPY N/A 11/05/2021   ;  Surgeon: Suzette Espy, MD;  Sigmoid colon diverticulosis, otherwise normal exam.  Recommended 10-year screening.   SHOULDER SURGERY      Current Outpatient Medications  Medication Sig Dispense Refill   alfuzosin (UROXATRAL) 10 MG 24 hr tablet Take 10 mg by mouth daily.     amLODipine  (NORVASC ) 5 MG tablet Take 5 mg by mouth every evening.     aspirin EC 81 MG tablet Take 81 mg by mouth daily. Swallow whole.     atorvastatin (LIPITOR) 20 MG tablet Take 20 mg by mouth daily.     benazepril  (LOTENSIN ) 10 MG tablet Take 10 mg by mouth every evening.  insulin  lispro protamine-lispro (HUMALOG 75/25 MIX) (75-25) 100 UNIT/ML SUSP injection Inject 60 Units into the skin daily.     JARDIANCE 25 MG TABS tablet Take 25 mg by mouth daily.     OZEMPIC, 1 MG/DOSE, 4 MG/3ML SOPN Inject 1 mg into the skin once a week.     polyethylene glycol-electrolytes (NULYTELY) 420 g solution Take 4,000 mLs by mouth once for 1 dose. 4000 mL 0   vitamin B-12 (CYANOCOBALAMIN ) 500 MCG tablet Take 1 tablet (500 mcg total) by mouth daily.     vitamin C (ASCORBIC ACID) 500 MG tablet Take 500 mg by mouth daily.     Vitamin D, Cholecalciferol, 25 MCG (1000 UT) TABS Take 1,000 Units by mouth daily.     No current facility-administered  medications for this visit.    Allergies as of 03/03/2024   (No Known Allergies)    Family History  Problem Relation Age of Onset   Hypertension Mother    Diabetes Mother    Hypertension Father    Colon cancer Paternal Uncle        after age 56   Inflammatory bowel disease Neg Hx     Social History   Socioeconomic History   Marital status: Married    Spouse name: Not on file   Number of children: Not on file   Years of education: Not on file   Highest education level: Not on file  Occupational History   Not on file  Tobacco Use   Smoking status: Never   Smokeless tobacco: Never  Vaping Use   Vaping status: Never Used  Substance and Sexual Activity   Alcohol use: Never   Drug use: Never   Sexual activity: Not Currently  Other Topics Concern   Not on file  Social History Narrative   Not on file   Social Drivers of Health   Financial Resource Strain: Not on file  Food Insecurity: Not on file  Transportation Needs: Not on file  Physical Activity: Not on file  Stress: Not on file  Social Connections: Not on file    Review of Systems: Gen: Denies fever, chills, cold or flulike symptoms, presyncope, syncope. CV: Denies chest pain, palpitations.  Resp: Denies dyspnea, cough.  GI: See HPI Heme: See HPI  Physical Exam: BP 100/65 (BP Location: Right Arm, Patient Position: Sitting, Cuff Size: Large)   Pulse 83   Temp 98.3 F (36.8 C) (Oral)   Ht 5\' 2"  (1.575 m)   Wt 205 lb 12.8 oz (93.4 kg)   SpO2 95%   BMI 37.64 kg/m  General:   Alert and oriented. No distress noted. Pleasant and cooperative.  Head:  Normocephalic and atraumatic. Eyes:  Conjuctiva clear without scleral icterus. Heart:  S1, S2 present without murmurs appreciated. Lungs:  Clear to auscultation bilaterally. No wheezes, rales, or rhonchi. No distress.  Abdomen:  +BS, soft, non-tender and non-distended. No rebound or guarding. No HSM or masses noted. Rectal: No external or internal  abnormalities appreciated. No gross blood.  Msk:  Symmetrical without gross deformities. Normal posture. Extremities:  Without edema. Neurologic:  Alert and  oriented x4 Psych:  Normal mood and affect.    Assessment:  58 year old male with history of diabetes, HTN, HLD, presenting today for further evaluation of rectal bleeding and mucus in his stool.  Symptoms have been occurring intermittently for more than a year now going several months between episodes, but when symptoms occur, they last for several weeks.  PCP has prescribed  Lialda and prednisone in the past for possible colitis.  Interestingly, patient reports these medications have helped him.  He has no associated abdominal pain, nausea, vomiting, diarrhea.  He does have increased bowel frequency with 2-3 bowel movements per day during these episodes, but stools remain formed.  No first degree relative with colon cancer and no Fhx of IBD.  Most recent colonoscopy in January 2023 showed sigmoid colon diverticulosis, otherwise normal exam.  Rectal exam today with no abnormalities appreciated.  Etiology of his symptoms are not clear, but as he is having rectal bleeding, I have recommended updating his colonoscopy with deep intubation of IT with biopsies to rule out IBD.  I will also update CBC check CRP and fecal calprotectin.    Plan:  CBC, CRP, fecal calprotectin Proceed with colonoscopy with propofol by Dr. Riley Cheadle in near future. The risks, benefits, and alternatives have been discussed with the patient in detail. The patient states understanding and desires to proceed.  ASA 2 Hold Ozempic x 1 week prior Hold Jardiance x 3 days prior 1 day prior: One half dose of insulin  at bedtime Day of: No morning diabetes medications Follow-up after colonoscopy.   Shana Daring, PA-C [facility] 03/03/2024

## 2024-03-03 ENCOUNTER — Encounter: Payer: Self-pay | Admitting: Gastroenterology

## 2024-03-03 ENCOUNTER — Other Ambulatory Visit: Payer: Self-pay | Admitting: *Deleted

## 2024-03-03 ENCOUNTER — Ambulatory Visit (INDEPENDENT_AMBULATORY_CARE_PROVIDER_SITE_OTHER): Admitting: Gastroenterology

## 2024-03-03 ENCOUNTER — Encounter: Payer: Self-pay | Admitting: *Deleted

## 2024-03-03 VITALS — BP 100/65 | HR 83 | Temp 98.3°F | Ht 62.0 in | Wt 205.8 lb

## 2024-03-03 DIAGNOSIS — R194 Change in bowel habit: Secondary | ICD-10-CM

## 2024-03-03 DIAGNOSIS — R195 Other fecal abnormalities: Secondary | ICD-10-CM

## 2024-03-03 DIAGNOSIS — K625 Hemorrhage of anus and rectum: Secondary | ICD-10-CM

## 2024-03-03 MED ORDER — PEG 3350-KCL-NA BICARB-NACL 420 G PO SOLR: 4000.0000 mL | Freq: Once | ORAL | 0 refills | Status: AC

## 2024-03-03 NOTE — Patient Instructions (Addendum)
 Please have labs completed at [facility].  [address] [place], [place]  We will arrange for you to have a colonoscopy with Dr. Riley Cheadle.   I will see you back after your colonoscopy.   Shana Daring, PA-C [facility]

## 2024-03-08 ENCOUNTER — Other Ambulatory Visit: Payer: Self-pay | Admitting: *Deleted

## 2024-03-08 DIAGNOSIS — K625 Hemorrhage of anus and rectum: Secondary | ICD-10-CM

## 2024-03-08 DIAGNOSIS — R194 Change in bowel habit: Secondary | ICD-10-CM

## 2024-03-09 ENCOUNTER — Ambulatory Visit: Payer: Self-pay | Admitting: Gastroenterology

## 2024-03-09 LAB — CBC WITH DIFFERENTIAL/PLATELET
Basophils Absolute: 0 10*3/uL (ref 0.0–0.2)
Basos: 1 %
EOS (ABSOLUTE): 0.3 10*3/uL (ref 0.0–0.4)
Eos: 8 %
Hematocrit: 46.5 % (ref 37.5–51.0)
Hemoglobin: 14.5 g/dL (ref 13.0–17.7)
Immature Grans (Abs): 0 10*3/uL (ref 0.0–0.1)
Immature Granulocytes: 0 %
Lymphocytes Absolute: 1.3 10*3/uL (ref 0.7–3.1)
Lymphs: 29 %
MCH: 26.3 pg — ABNORMAL LOW (ref 26.6–33.0)
MCHC: 31.2 g/dL — ABNORMAL LOW (ref 31.5–35.7)
MCV: 84 fL (ref 79–97)
Monocytes Absolute: 0.2 10*3/uL (ref 0.1–0.9)
Monocytes: 5 %
Neutrophils Absolute: 2.5 10*3/uL (ref 1.4–7.0)
Neutrophils: 57 %
Platelets: 353 10*3/uL (ref 150–450)
RBC: 5.52 x10E6/uL (ref 4.14–5.80)
RDW: 15.8 % — ABNORMAL HIGH (ref 11.6–15.4)
WBC: 4.4 10*3/uL (ref 3.4–10.8)

## 2024-03-09 LAB — CALPROTECTIN, FECAL

## 2024-03-09 LAB — C-REACTIVE PROTEIN: CRP: 3 mg/L (ref 0–10)

## 2024-03-12 ENCOUNTER — Ambulatory Visit: Payer: Self-pay | Admitting: Gastroenterology

## 2024-03-12 LAB — CALPROTECTIN, FECAL: Calprotectin, Fecal: 1270 ug/g — ABNORMAL HIGH (ref 0–120)

## 2024-03-24 ENCOUNTER — Encounter (HOSPITAL_COMMUNITY): Payer: Self-pay | Admitting: Internal Medicine

## 2024-03-24 ENCOUNTER — Ambulatory Visit (HOSPITAL_COMMUNITY): Admitting: Anesthesiology

## 2024-03-24 ENCOUNTER — Other Ambulatory Visit: Payer: Self-pay

## 2024-03-24 ENCOUNTER — Encounter (HOSPITAL_COMMUNITY)
Admission: RE | Disposition: A | Discharge status: Home / Self Care | Payer: Self-pay | Source: Home / Self Care | Attending: Internal Medicine

## 2024-03-24 ENCOUNTER — Ambulatory Visit (HOSPITAL_COMMUNITY)
Admission: RE | Admit: 2024-03-24 | Discharge: 2024-03-24 | Disposition: A | Discharge status: Home / Self Care | Attending: Internal Medicine | Admitting: Internal Medicine

## 2024-03-24 DIAGNOSIS — K6289 Other specified diseases of anus and rectum: Secondary | ICD-10-CM

## 2024-03-24 DIAGNOSIS — Z7985 Long-term (current) use of injectable non-insulin antidiabetic drugs: Secondary | ICD-10-CM | POA: Insufficient documentation

## 2024-03-24 DIAGNOSIS — Z79899 Other long term (current) drug therapy: Secondary | ICD-10-CM | POA: Insufficient documentation

## 2024-03-24 DIAGNOSIS — E119 Type 2 diabetes mellitus without complications: Secondary | ICD-10-CM | POA: Insufficient documentation

## 2024-03-24 DIAGNOSIS — K5130 Ulcerative (chronic) rectosigmoiditis without complications: Secondary | ICD-10-CM | POA: Diagnosis not present

## 2024-03-24 DIAGNOSIS — Z794 Long term (current) use of insulin: Secondary | ICD-10-CM | POA: Diagnosis not present

## 2024-03-24 DIAGNOSIS — I10 Essential (primary) hypertension: Secondary | ICD-10-CM | POA: Diagnosis not present

## 2024-03-24 DIAGNOSIS — K921 Melena: Secondary | ICD-10-CM | POA: Insufficient documentation

## 2024-03-24 DIAGNOSIS — K529 Noninfective gastroenteritis and colitis, unspecified: Secondary | ICD-10-CM

## 2024-03-24 DIAGNOSIS — Z7984 Long term (current) use of oral hypoglycemic drugs: Secondary | ICD-10-CM | POA: Diagnosis not present

## 2024-03-24 DIAGNOSIS — K5730 Diverticulosis of large intestine without perforation or abscess without bleeding: Secondary | ICD-10-CM | POA: Insufficient documentation

## 2024-03-24 DIAGNOSIS — Z833 Family history of diabetes mellitus: Secondary | ICD-10-CM | POA: Diagnosis not present

## 2024-03-24 DIAGNOSIS — Z5309 Procedure and treatment not carried out because of other contraindication: Secondary | ICD-10-CM | POA: Diagnosis not present

## 2024-03-24 DIAGNOSIS — Z7982 Long term (current) use of aspirin: Secondary | ICD-10-CM | POA: Diagnosis not present

## 2024-03-24 DIAGNOSIS — E785 Hyperlipidemia, unspecified: Secondary | ICD-10-CM | POA: Insufficient documentation

## 2024-03-24 DIAGNOSIS — Z800 Family history of malignant neoplasm of digestive organs: Secondary | ICD-10-CM | POA: Diagnosis not present

## 2024-03-24 HISTORY — PX: COLONOSCOPY: SHX5424

## 2024-03-24 LAB — GLUCOSE, CAPILLARY
Glucose-Capillary: 56 mg/dL — ABNORMAL LOW (ref 70–99)
Glucose-Capillary: 168 mg/dL — ABNORMAL HIGH (ref 70–99)

## 2024-03-24 SURGERY — COLONOSCOPY
Anesthesia: General

## 2024-03-24 MED ORDER — PROPOFOL 10 MG/ML IV BOLUS
INTRAVENOUS | Status: DC | PRN
  Administered 2024-03-24: 100 mg via INTRAVENOUS

## 2024-03-24 MED ORDER — DEXTROSE 50 % IV SOLN
INTRAVENOUS | Status: DC | PRN
  Administered 2024-03-24: 25 g via INTRAVENOUS

## 2024-03-24 MED ORDER — LACTATED RINGERS IV SOLN: INTRAVENOUS | Status: DC

## 2024-03-24 MED ORDER — PROPOFOL 500 MG/50ML IV EMUL
INTRAVENOUS | Status: DC | PRN
  Administered 2024-03-24: 150 ug/kg/min via INTRAVENOUS

## 2024-03-24 MED ORDER — LIDOCAINE 2% (20 MG/ML) 5 ML SYRINGE
INTRAMUSCULAR | Status: DC | PRN
  Administered 2024-03-24: 100 mg via INTRAVENOUS

## 2024-03-24 MED ORDER — DEXTROSE 50 % IV SOLN
INTRAVENOUS | Status: AC
  Filled 2024-03-24: qty 50

## 2024-03-24 NOTE — Transfer of Care (Signed)
 Immediate Anesthesia Transfer of Care Note  Patient: Keith Best  Procedure(s) Performed: COLONOSCOPY  Patient Location: Endoscopy Unit  Anesthesia Type:General  Level of Consciousness: drowsy  Airway & Oxygen  Therapy: Patient Spontanous Breathing  Post-op Assessment: Report given to RN and Post -op Vital signs reviewed and stable  Post vital signs: Reviewed and stable  Last Vitals:  Vitals Value Taken Time  BP    Temp    Pulse    Resp    SpO2      Last Pain:  Vitals:   03/24/24 1322  TempSrc:   PainSc: 0-No pain      Patients Stated Pain Goal: 3 (03/24/24 1200)  Complications: No notable events documented.

## 2024-03-24 NOTE — Anesthesia Procedure Notes (Signed)
 Date/Time: 03/24/2024 1:21 PM  Performed by: Sherwin Donate, CRNAPre-anesthesia Checklist: Patient identified, Emergency Drugs available, Suction available and Patient being monitored Patient Re-evaluated:Patient Re-evaluated prior to induction Oxygen  Delivery Method: Nasal cannula Induction Type: IV induction Placement Confirmation: positive ETCO2

## 2024-03-24 NOTE — Anesthesia Preprocedure Evaluation (Signed)
 Anesthesia Evaluation  Patient identified by MRN, date of birth, ID band Patient awake    Reviewed: Allergy & Precautions, H&P , NPO status , Patient's Chart, lab work & pertinent test results, reviewed documented beta blocker date and time   Airway Mallampati: II  TM Distance: >3 FB Neck ROM: full    Dental no notable dental hx.    Pulmonary neg pulmonary ROS   Pulmonary exam normal breath sounds clear to auscultation       Cardiovascular Exercise Tolerance: Good hypertension, negative cardio ROS  Rhythm:regular Rate:Normal     Neuro/Psych negative neurological ROS  negative psych ROS   GI/Hepatic negative GI ROS, Neg liver ROS,,,  Endo/Other  negative endocrine ROSdiabetes    Renal/GU negative Renal ROS  negative genitourinary   Musculoskeletal   Abdominal   Peds  Hematology negative hematology ROS (+)   Anesthesia Other Findings   Reproductive/Obstetrics negative OB ROS                             Anesthesia Physical Anesthesia Plan  ASA: 2  Anesthesia Plan: General   Post-op Pain Management:    Induction:   PONV Risk Score and Plan: Propofol infusion  Airway Management Planned:   Additional Equipment:   Intra-op Plan:   Post-operative Plan:   Informed Consent: I have reviewed the patients History and Physical, chart, labs and discussed the procedure including the risks, benefits and alternatives for the proposed anesthesia with the patient or authorized representative who has indicated his/her understanding and acceptance.     Dental Advisory Given  Plan Discussed with: CRNA  Anesthesia Plan Comments:        Anesthesia Quick Evaluation

## 2024-03-24 NOTE — Op Note (Signed)
 [facility] Patient Name: Keith Best Procedure Date: 03/24/2024 1:08 PM MRN: [id number] Date of Birth: 02/04/1966 Attending MD: Gemma Kelp , MD, [id number] CSN: [id number] Age: 58 Admit Type: Outpatient Procedure:                Colonoscopy Indications:              Hematochezia Providers:                Gemma Kelp, MD, Karyle Pagoda, RN, Alisa App, Annell Barrow Referring MD:              Medicines:                Propofol per Anesthesia Complications:            No immediate complications. Estimated Blood Loss:     Estimated blood loss was minimal. Procedure:                Pre-Anesthesia Assessment:                           - Prior to the procedure, a History and Physical                            was performed, and patient medications and                            allergies were reviewed. The patient's tolerance of                            previous anesthesia was also reviewed. The risks                            and benefits of the procedure and the sedation                            options and risks were discussed with the patient.                            All questions were answered, and informed consent                            was obtained. Prior Anticoagulants: The patient has                            taken no anticoagulant or antiplatelet agents. ASA                            Grade Assessment: II - A patient with mild systemic                            disease. After reviewing the risks and benefits,  the patient was deemed in satisfactory condition to                            undergo the procedure.                           After obtaining informed consent, the colonoscope                            was passed under direct vision. Throughout the                            procedure, the patient's blood pressure, pulse, and                            oxygen  saturations  were monitored continuously. The                            [id number]) scope was introduced through                            the anus with the intention of advancing to the                            cecum. The scope was advanced to the descending                            colon before the procedure was aborted. Medications                            were given. The colonoscopy was performed without                            difficulty. The patient tolerated the procedure                            well. The quality of the bowel preparation was                            inadequate. The rectum was photographed. Scope In: 1:26:21 PM Scope Out: 1:32:52 PM Total Procedure Duration: 0 hours 6 minutes 31 seconds  Findings:      The perianal and digital rectal examinations were normal. Diffusely       abnormal rectal and sigmoid mucosa with complete loss of normal vascular       pattern. Granularity friability of the entire mucosa through the sigmoid       it rapidly tapered off into the distal descending where the mucosa       appeared normal. I was unable to complete examination because I ran into       viscous formed to semiformed stool in the proximal descending segment.      Biopsies of the normal-appearing distal descending, sigmoid and rectal       mucosa were taken and submitted separately. Rectum to inflamed and small       to perform retroflexion.  Mucosa seen well on?"face. Impression:               - Preparation of the colon was inadequate.                           - Left-sided proctocolitis. Status post biopsy.                            Findings consistent with new onset inflammatory                            bowel disease Moderate Sedation:      Moderate (conscious) sedation was personally administered by an       anesthesia professional. The following parameters were monitored: oxygen        saturation, heart rate, blood pressure, respiratory rate, EKG, adequacy        of pulmonary ventilation, and response to care. Recommendation:           - Patient has a contact number available for                            emergencies. The signs and symptoms of potential                            delayed complications were discussed with the                            patient. Return to normal activities tomorrow.                            Written discharge instructions were provided to the                            patient.                           - Advance diet as tolerated. Begin Lialda 4.8 g                            daily. Follow-up on pathology. Office visit in 2                            months. Timing of future colonoscopy to be                            determined. He just had a complete colonoscopy                            about 2-1/2 years ago. Procedure Code(s):        --- Professional ---                           [phone], 53, Colonoscopy, flexible; diagnostic,                            including collection  of specimen(s) by brushing or                            washing, when performed (separate procedure) Diagnosis Code(s):        --- Professional ---                           K92.1, Melena (includes Hematochezia) CPT copyright 2022 [employer]. All rights reserved. The codes documented in this report are preliminary and upon coder review may  be revised to meet current compliance requirements. Windsor Hatcher. Osiel Stick, MD Gemma Kelp, MD 03/24/2024 1:45:58 PM This report has been signed electronically. Number of Addenda: 0

## 2024-03-24 NOTE — Interval H&P Note (Signed)
 History and Physical Interval Note:  03/24/2024 1:18 PM  Keith Best  has presented today for surgery, with the diagnosis of rectal bleeding,change in bowel habits.  The various methods of treatment have been discussed with the patient and family. After consideration of risks, benefits and other options for treatment, the patient has consented to  Procedure(s) with comments: COLONOSCOPY (N/A) - 1:45 pm, asa 2 as a surgical intervention.  The patient's history has been reviewed, patient examined, no change in status, stable for surgery.  I have reviewed the patient's chart and labs.  Questions were answered to the patient's satisfaction.     Garnette Ka    Paper hematochezia.  Denies diarrhea 1-2 formed bowel movements daily.  Diagnostic colonoscopy today per plan.The risks, benefits, limitations, alternatives and imponderables have been reviewed with the patient. Questions have been answered. All parties are agreeable.

## 2024-03-24 NOTE — Discharge Instructions (Addendum)
    Sigmoidoscopy Discharge Instructions  Read the instructions outlined below and refer to this sheet in the next few weeks. These discharge instructions provide you with general information on caring for yourself after you leave the hospital. Your doctor may also give you specific instructions. While your treatment has been planned according to the most current medical practices available, unavoidable complications occasionally occur. If you have any problems or questions after discharge, call Dr. Riley Cheadle at [phone]. ACTIVITY You may resume your regular activity, but move at a slower pace for the next 24 hours.  Take frequent rest periods for the next 24 hours.  Walking will help get rid of the air and reduce the bloated feeling in your belly (abdomen).  No driving for 24 hours (because of the medicine (anesthesia) used during the test).   Do not sign any important legal documents or operate any machinery for 24 hours (because of the anesthesia used during the test).  NUTRITION Drink plenty of fluids.  You may resume your normal diet as instructed by your doctor.  Begin with a light meal and progress to your normal diet. Heavy or fried foods are harder to digest and may make you feel sick to your stomach (nauseated).  Avoid alcoholic beverages for 24 hours or as instructed.  MEDICATIONS You may resume your normal medications unless your doctor tells you otherwise.  WHAT YOU CAN EXPECT TODAY Some feelings of bloating in the abdomen.  Passage of more gas than usual.  Spotting of blood in your stool or on the toilet paper.  IF YOU HAD POLYPS REMOVED DURING THE COLONOSCOPY: No aspirin products for 7 days or as instructed.  No alcohol for 7 days or as instructed.  Eat a soft diet for the next 24 hours.  FINDING OUT THE RESULTS OF YOUR TEST Not all test results are available during your visit. If your test results are not back during the visit, make an appointment with your caregiver to find out  the results. Do not assume everything is normal if you have not heard from your caregiver or the medical facility. It is important for you to follow up on all of your test results.  SEEK IMMEDIATE MEDICAL ATTENTION IF: You have more than a spotting of blood in your stool.  Your belly is swollen (abdominal distention).  You are nauseated or vomiting.  You have a temperature over 101.  You have abdominal pain or discomfort that is severe or gets worse throughout the day.       your prep was too poor to complete colonoscopy.  However I found your problem.  You have left-sided proctitis and colitis.  You have inflammatory bowel disease (ulcerative colitis)   biopsies taken  Will begin Lialda at a dose of 4.8 g daily ( 4 capsules)  once daily.  New prescription is being sent to your pharmacy from the office.    Further recommendations to follow pending review of pathology report   office visit with Azalee Lenz in 2 months. Message sent to office and will schedule this appointment.

## 2024-03-24 NOTE — Interval H&P Note (Signed)
   History and Physical Interval Note:  03/24/2024 1:12 PM  Keith Best  has presented today for surgery, with the diagnosis of rectal bleeding,change in bowel habits.  The various methods of treatment have been discussed with the patient and family. After consideration of risks, benefits and other options for treatment, the patient has consented to  Procedure(s) with comments: COLONOSCOPY (N/A) - 1:45 pm, asa 2 as a surgical intervention.  The patient's history has been reviewed, patient examined, no change in status, stable for surgery.  I have reviewed the patient's chart and labs.  Questions were answered to the patient's satisfaction.     Nalda Shackleford   no change.  Diagnostic colonoscopy for low-volume rectal bleeding. The risks, benefits, limitations, alternatives and imponderables have been reviewed with the patient. Questions have been answered. All parties are agreeable.

## 2024-03-26 NOTE — Anesthesia Postprocedure Evaluation (Signed)
 Anesthesia Post Note  Patient: Keith Best  Procedure(s) Performed: COLONOSCOPY  Patient location during evaluation: Phase II Anesthesia Type: General Level of consciousness: awake Pain management: pain level controlled Vital Signs Assessment: post-procedure vital signs reviewed and stable Respiratory status: spontaneous breathing and respiratory function stable Cardiovascular status: blood pressure returned to baseline and stable Postop Assessment: no headache and no apparent nausea or vomiting Anesthetic complications: no Comments: Late entry   No notable events documented.   Last Vitals:  Vitals:   03/24/24 1336 03/24/24 1338  BP: (!) 83/50 (!) 105/59  Pulse: 79 78  Resp: (!) 24 17  Temp: 36.4 C   SpO2: 97% 97%    Last Pain:  Vitals:   03/24/24 1336  TempSrc: Oral  PainSc: 0-No pain                 Coretha Dew

## 2024-03-27 ENCOUNTER — Ambulatory Visit: Payer: Self-pay | Admitting: Internal Medicine

## 2024-03-27 ENCOUNTER — Other Ambulatory Visit: Payer: Self-pay

## 2024-03-27 ENCOUNTER — Telehealth: Payer: Self-pay

## 2024-03-27 LAB — SURGICAL PATHOLOGY

## 2024-03-27 MED ORDER — MESALAMINE 1.2 G PO TBEC: 4.8000 g | DELAYED_RELEASE_TABLET | Freq: Every day | ORAL | 11 refills | Status: AC

## 2024-03-27 NOTE — Telephone Encounter (Signed)
 Rx sent to pharmacy on file.

## 2024-03-27 NOTE — Telephone Encounter (Signed)
-----   Message from Garnette Ka sent at 03/24/2024  1:38 PM EDT -----  patient has left-sided proctocolitis.  New prescription for Lialda 1.2 g tablet.  Dispense 120 take 4.8 g daily.  11 refills.

## 2024-03-28 ENCOUNTER — Encounter (HOSPITAL_COMMUNITY): Payer: Self-pay | Admitting: Internal Medicine

## 2024-03-29 DIAGNOSIS — E1122 Type 2 diabetes mellitus with diabetic chronic kidney disease: Secondary | ICD-10-CM | POA: Diagnosis not present

## 2024-03-30 NOTE — Progress Notes (Signed)
 Scheduled the follow up 2 month appt

## 2024-04-07 DIAGNOSIS — M6528 Calcific tendinitis, other site: Secondary | ICD-10-CM | POA: Diagnosis not present

## 2024-04-07 DIAGNOSIS — I4510 Unspecified right bundle-branch block: Secondary | ICD-10-CM | POA: Diagnosis not present

## 2024-04-07 DIAGNOSIS — M779 Enthesopathy, unspecified: Secondary | ICD-10-CM | POA: Diagnosis not present

## 2024-04-07 DIAGNOSIS — M25512 Pain in left shoulder: Secondary | ICD-10-CM | POA: Diagnosis not present

## 2024-04-07 DIAGNOSIS — M19012 Primary osteoarthritis, left shoulder: Secondary | ICD-10-CM | POA: Diagnosis not present

## 2024-04-07 DIAGNOSIS — I4519 Other right bundle-branch block: Secondary | ICD-10-CM | POA: Diagnosis not present

## 2024-04-07 DIAGNOSIS — M7532 Calcific tendinitis of left shoulder: Secondary | ICD-10-CM | POA: Diagnosis not present

## 2024-05-29 NOTE — Progress Notes (Unsigned)
 Referring Provider: Orpha Yancey LABOR, MD Primary Care Physician:  Orpha Yancey LABOR, MD Primary GI Physician: Dr. Shaaron  No chief complaint on file.   HPI:   Keith Best is a 58 y.o. male presenting today for follow-up of rectal bleeding, change in bowel habits with newly diagnosed proctosigmoiditis.   Prior office visit in January 2024 with patient reporting 3-week course of mucus in his stool.  Dr. Hassani had started him on Lialda  1.2 g twice a day which he took for a while on an as-needed basis.  By the time of his office visit, mucus in his stool had resolved and Dr. Shaaron recommended stopping Lialda .  At his last visit in May 2025, he reported intermittent episodes of rectal bleeding and mucus in his stool for more than a year now, but going several months between episodes.  Noted when symptoms would occur, they lasted several weeks at a time.  Reported PCP had prescribed Lialda  and prednisone in the past for possible colitis.  Interestingly, patient reports these medications had helped him.  He had no associated abdominal pain, nausea, vomiting, or diarrhea.  He did report increased bowel frequency with 2-3 bowel movements per day during those episodes.  Recommended proceeding with colonoscopy, CBC, CRP, fecal calprotectin.  Labs completed showing hemoglobin 14.5, CRP within normal limits.  Fecal calprotectin was elevated at 1270.  Colonoscopy 03/24/2024: Inadequate prep.  Left-sided proctocolitis s/p biopsy.  Findings consistent with new onset inflammatory bowel disease.  Recommended starting Lialda  4.8 g daily.  Pathology with chronic active colitis consistent with inflammatory bowel disease in the sigmoid colon and rectum.  Biopsies from the descending colon were benign.  Today:    Past Medical History:  Diagnosis Date   Diabetes mellitus without complication (HCC)    Hypertension     Past Surgical History:  Procedure Laterality Date   COLONOSCOPY     [facility]    COLONOSCOPY N/A 11/05/2021   ;  Surgeon: Shaaron Lamar HERO, MD;  Sigmoid colon diverticulosis, otherwise normal exam.  Recommended 10-year screening.   COLONOSCOPY N/A 03/24/2024   Procedure: COLONOSCOPY;  Surgeon: Shaaron Lamar HERO, MD;  Location: [facility];  Service: Endoscopy;  Laterality: N/A;  1:45 pm, asa 2   SHOULDER SURGERY      Current Outpatient Medications  Medication Sig Dispense Refill   alfuzosin (UROXATRAL) 10 MG 24 hr tablet Take 10 mg by mouth daily.     amLODipine  (NORVASC ) 5 MG tablet Take 5 mg by mouth every evening.     aspirin EC 81 MG tablet Take 81 mg by mouth daily. Swallow whole.     atorvastatin (LIPITOR) 20 MG tablet Take 20 mg by mouth daily.     benazepril  (LOTENSIN ) 10 MG tablet Take 10 mg by mouth every evening.     insulin  lispro protamine-lispro (HUMALOG 75/25 MIX) (75-25) 100 UNIT/ML SUSP injection Inject 60 Units into the skin daily.     JARDIANCE 25 MG TABS tablet Take 25 mg by mouth daily.     mesalamine  (LIALDA ) 1.2 g EC tablet Take 4 tablets (4.8 g total) by mouth daily with breakfast. 120 tablet 11   OZEMPIC, 1 MG/DOSE, 4 MG/3ML SOPN Inject 1 mg into the skin once a week.     vitamin B-12 (CYANOCOBALAMIN ) 500 MCG tablet Take 1 tablet (500 mcg total) by mouth daily.     vitamin C (ASCORBIC ACID) 500 MG tablet Take 500 mg by mouth daily.  Vitamin D, Cholecalciferol, 25 MCG (1000 UT) TABS Take 1,000 Units by mouth daily.     No current facility-administered medications for this visit.    Allergies as of 05/31/2024   (No Known Allergies)    Family History  Problem Relation Age of Onset   Hypertension Mother    Diabetes Mother    Hypertension Father    Colon cancer Paternal Uncle        after age 84   Inflammatory bowel disease Neg Hx     Social History   Socioeconomic History   Marital status: Married    Spouse name: Not on file   Number of children: Not on file   Years of education: Not on file   Highest education level: Not on  file  Occupational History   Not on file  Tobacco Use   Smoking status: Never   Smokeless tobacco: Never  Vaping Use   Vaping status: Never Used  Substance and Sexual Activity   Alcohol use: Never   Drug use: Never   Sexual activity: Not Currently  Other Topics Concern   Not on file  Social History Narrative   Not on file   Social Drivers of Health   Financial Resource Strain: Not on file  Food Insecurity: Not on file  Transportation Needs: Not on file  Physical Activity: Not on file  Stress: Not on file  Social Connections: Not on file    Review of Systems: Gen: Denies fever, chills, anorexia. Denies fatigue, weakness, weight loss.  CV: Denies chest pain, palpitations, syncope, peripheral edema, and claudication. Resp: Denies dyspnea at rest, cough, wheezing, coughing up blood, and pleurisy. GI: Denies vomiting blood, jaundice, and fecal incontinence.   Denies dysphagia or odynophagia. Derm: Denies rash, itching, dry skin Psych: Denies depression, anxiety, memory loss, confusion. No homicidal or suicidal ideation.  Heme: Denies bruising, bleeding, and enlarged lymph nodes.  Physical Exam: There were no vitals taken for this visit. General:   Alert and oriented. No distress noted. Pleasant and cooperative.  Head:  Normocephalic and atraumatic. Eyes:  Conjuctiva clear without scleral icterus. Heart:  S1, S2 present without murmurs appreciated. Lungs:  Clear to auscultation bilaterally. No wheezes, rales, or rhonchi. No distress.  Abdomen:  +BS, soft, non-tender and non-distended. No rebound or guarding. No HSM or masses noted. Msk:  Symmetrical without gross deformities. Normal posture. Extremities:  Without edema. Neurologic:  Alert and  oriented x4 Psych:  Normal mood and affect.    Assessment:     Plan:  ***   Josette Centers, PA-C [facility] 05/31/2024

## 2024-05-31 ENCOUNTER — Encounter: Payer: Self-pay | Admitting: Gastroenterology

## 2024-05-31 ENCOUNTER — Ambulatory Visit: Admitting: Gastroenterology

## 2024-05-31 VITALS — BP 125/81 | HR 90 | Temp 97.9°F | Ht 63.0 in | Wt 214.2 lb

## 2024-05-31 DIAGNOSIS — K5130 Ulcerative (chronic) rectosigmoiditis without complications: Secondary | ICD-10-CM

## 2024-05-31 NOTE — Patient Instructions (Signed)
 Resume taking Lialda  4.8 g daily.  Have your routine blood work with Dr. Orpha every 3 months as you do usually.  Please request that he send these results to our office we can keep an eye on your kidney function and electrolytes as well.  We will have you follow-up with Dr. Shaaron in 6 months or sooner if needed.  Josette Centers, PA-C [facility]

## 2024-06-21 DIAGNOSIS — M62830 Muscle spasm of back: Secondary | ICD-10-CM | POA: Diagnosis not present

## 2024-06-21 DIAGNOSIS — N400 Enlarged prostate without lower urinary tract symptoms: Secondary | ICD-10-CM | POA: Diagnosis not present

## 2024-06-21 DIAGNOSIS — I10 Essential (primary) hypertension: Secondary | ICD-10-CM | POA: Diagnosis not present

## 2024-06-21 DIAGNOSIS — E1122 Type 2 diabetes mellitus with diabetic chronic kidney disease: Secondary | ICD-10-CM | POA: Diagnosis not present

## 2024-07-24 NOTE — Progress Notes (Unsigned)
 Keith Best                                          MRN: [id number]   07/24/2024   The [facility] Quality Team Specialist reviewed this patient medical record for the purposes of chart review for care gap closure. The following were reviewed: chart review for care gap closure-kidney health evaluation for diabetes:{CHL [facility] QUALITY SPECIALIST CARE GAP SUB MEASURES:[phone]}.    [facility] Quality Team

## 2024-09-28 DIAGNOSIS — Z6835 Body mass index (BMI) 35.0-35.9, adult: Secondary | ICD-10-CM | POA: Diagnosis not present

## 2024-09-28 DIAGNOSIS — Z0000 Encounter for general adult medical examination without abnormal findings: Secondary | ICD-10-CM | POA: Diagnosis not present

## 2024-09-29 DIAGNOSIS — N186 End stage renal disease: Secondary | ICD-10-CM | POA: Diagnosis not present

## 2024-09-29 DIAGNOSIS — E1122 Type 2 diabetes mellitus with diabetic chronic kidney disease: Secondary | ICD-10-CM | POA: Diagnosis not present

## 2024-10-31 ENCOUNTER — Encounter: Payer: Self-pay | Admitting: Internal Medicine
